# Patient Record
Sex: Female | Born: 1980 | Race: Black or African American | Hispanic: No | Marital: Single | Smoking: Never smoker
Health system: Southern US, Community
[De-identification: ages and names within clinical notes are randomized; demographics above are authoritative.]

## PROBLEM LIST (undated history)

## (undated) DIAGNOSIS — R011 Cardiac murmur, unspecified: Secondary | ICD-10-CM

## (undated) DIAGNOSIS — L709 Acne, unspecified: Secondary | ICD-10-CM

## (undated) DIAGNOSIS — D649 Anemia, unspecified: Secondary | ICD-10-CM

## (undated) DIAGNOSIS — K219 Gastro-esophageal reflux disease without esophagitis: Secondary | ICD-10-CM

## (undated) DIAGNOSIS — I10 Essential (primary) hypertension: Secondary | ICD-10-CM

## (undated) HISTORY — PX: OTHER SURGICAL HISTORY: SHX169

## (undated) HISTORY — DX: Essential (primary) hypertension: I10

## (undated) HISTORY — DX: Acne, unspecified: L70.9

---
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
## 2000-02-09 ENCOUNTER — Inpatient Hospital Stay (HOSPITAL_COMMUNITY): Admission: AD | Admit: 2000-02-09 | Discharge: 2000-02-09 | Payer: Self-pay | Admitting: Obstetrics

## 2000-05-12 ENCOUNTER — Inpatient Hospital Stay (HOSPITAL_COMMUNITY): Admission: AD | Admit: 2000-05-12 | Discharge: 2000-05-12 | Payer: Self-pay | Admitting: Obstetrics

## 2000-08-23 ENCOUNTER — Inpatient Hospital Stay (HOSPITAL_COMMUNITY): Admission: AD | Admit: 2000-08-23 | Discharge: 2000-08-23 | Payer: Self-pay | Admitting: Obstetrics

## 2000-11-10 ENCOUNTER — Inpatient Hospital Stay (HOSPITAL_COMMUNITY): Admission: AD | Admit: 2000-11-10 | Discharge: 2000-11-10 | Payer: Self-pay | Admitting: Obstetrics

## 2000-12-14 ENCOUNTER — Inpatient Hospital Stay (HOSPITAL_COMMUNITY): Admission: AD | Admit: 2000-12-14 | Discharge: 2000-12-14 | Payer: Self-pay | Admitting: Obstetrics

## 2001-01-24 ENCOUNTER — Inpatient Hospital Stay (HOSPITAL_COMMUNITY): Admission: AD | Admit: 2001-01-24 | Discharge: 2001-01-24 | Payer: Self-pay | Admitting: Obstetrics

## 2001-06-03 ENCOUNTER — Inpatient Hospital Stay (HOSPITAL_COMMUNITY): Admission: AD | Admit: 2001-06-03 | Discharge: 2001-06-03 | Payer: Self-pay | Admitting: Obstetrics

## 2001-12-03 ENCOUNTER — Inpatient Hospital Stay (HOSPITAL_COMMUNITY): Admission: AD | Admit: 2001-12-03 | Discharge: 2001-12-03 | Payer: Self-pay | Admitting: Obstetrics

## 2002-01-16 ENCOUNTER — Encounter: Payer: Self-pay | Admitting: Obstetrics

## 2002-01-16 ENCOUNTER — Ambulatory Visit (HOSPITAL_COMMUNITY): Admission: RE | Admit: 2002-01-16 | Discharge: 2002-01-16 | Payer: Self-pay | Admitting: Obstetrics

## 2002-01-31 ENCOUNTER — Emergency Department (HOSPITAL_COMMUNITY): Admission: EM | Admit: 2002-01-31 | Discharge: 2002-01-31 | Payer: Self-pay | Admitting: Emergency Medicine

## 2002-03-09 ENCOUNTER — Observation Stay (HOSPITAL_COMMUNITY): Admission: AD | Admit: 2002-03-09 | Discharge: 2002-03-10 | Payer: Self-pay | Admitting: Obstetrics

## 2002-03-09 ENCOUNTER — Encounter: Payer: Self-pay | Admitting: Obstetrics

## 2002-03-19 ENCOUNTER — Inpatient Hospital Stay (HOSPITAL_COMMUNITY): Admission: AD | Admit: 2002-03-19 | Discharge: 2002-03-19 | Payer: Self-pay | Admitting: Obstetrics

## 2002-05-28 ENCOUNTER — Inpatient Hospital Stay (HOSPITAL_COMMUNITY): Admission: AD | Admit: 2002-05-28 | Discharge: 2002-05-28 | Payer: Self-pay | Admitting: Obstetrics

## 2002-07-14 ENCOUNTER — Inpatient Hospital Stay (HOSPITAL_COMMUNITY): Admission: AD | Admit: 2002-07-14 | Discharge: 2002-07-16 | Payer: Self-pay | Admitting: Obstetrics

## 2003-01-25 ENCOUNTER — Encounter (INDEPENDENT_AMBULATORY_CARE_PROVIDER_SITE_OTHER): Payer: Self-pay

## 2003-01-25 ENCOUNTER — Inpatient Hospital Stay (HOSPITAL_COMMUNITY): Admission: AD | Admit: 2003-01-25 | Discharge: 2003-01-25 | Payer: Self-pay | Admitting: Obstetrics

## 2003-02-27 ENCOUNTER — Inpatient Hospital Stay (HOSPITAL_COMMUNITY): Admission: AD | Admit: 2003-02-27 | Discharge: 2003-02-27 | Payer: Self-pay | Admitting: Obstetrics

## 2003-06-15 ENCOUNTER — Inpatient Hospital Stay (HOSPITAL_COMMUNITY): Admission: AD | Admit: 2003-06-15 | Discharge: 2003-06-15 | Payer: Self-pay | Admitting: Obstetrics

## 2003-07-24 ENCOUNTER — Inpatient Hospital Stay (HOSPITAL_COMMUNITY): Admission: AD | Admit: 2003-07-24 | Discharge: 2003-07-24 | Payer: Self-pay | Admitting: Obstetrics

## 2003-08-31 ENCOUNTER — Inpatient Hospital Stay (HOSPITAL_COMMUNITY): Admission: AD | Admit: 2003-08-31 | Discharge: 2003-08-31 | Payer: Self-pay | Admitting: Obstetrics & Gynecology

## 2003-09-18 ENCOUNTER — Inpatient Hospital Stay (HOSPITAL_COMMUNITY): Admission: AD | Admit: 2003-09-18 | Discharge: 2003-09-18 | Payer: Self-pay | Admitting: Obstetrics and Gynecology

## 2003-11-07 ENCOUNTER — Emergency Department (HOSPITAL_COMMUNITY): Admission: EM | Admit: 2003-11-07 | Discharge: 2003-11-08 | Payer: Self-pay | Admitting: Emergency Medicine

## 2004-01-05 ENCOUNTER — Inpatient Hospital Stay (HOSPITAL_COMMUNITY): Admission: AD | Admit: 2004-01-05 | Discharge: 2004-01-05 | Payer: Self-pay | Admitting: Family Medicine

## 2005-01-27 ENCOUNTER — Inpatient Hospital Stay (HOSPITAL_COMMUNITY): Admission: AD | Admit: 2005-01-27 | Discharge: 2005-01-27 | Payer: Self-pay | Admitting: Obstetrics

## 2006-05-17 ENCOUNTER — Emergency Department (HOSPITAL_COMMUNITY): Admission: EM | Admit: 2006-05-17 | Discharge: 2006-05-17 | Payer: Self-pay | Admitting: Emergency Medicine

## 2006-09-07 ENCOUNTER — Emergency Department (HOSPITAL_COMMUNITY): Admission: EM | Admit: 2006-09-07 | Discharge: 2006-09-07 | Payer: Self-pay | Admitting: Family Medicine

## 2006-09-26 ENCOUNTER — Emergency Department (HOSPITAL_COMMUNITY): Admission: EM | Admit: 2006-09-26 | Discharge: 2006-09-26 | Payer: Self-pay | Admitting: Emergency Medicine

## 2007-01-19 ENCOUNTER — Emergency Department (HOSPITAL_COMMUNITY): Admission: EM | Admit: 2007-01-19 | Discharge: 2007-01-19 | Payer: Self-pay | Admitting: Emergency Medicine

## 2007-07-11 ENCOUNTER — Emergency Department (HOSPITAL_COMMUNITY): Admission: EM | Admit: 2007-07-11 | Discharge: 2007-07-11 | Payer: Self-pay | Admitting: Family Medicine

## 2007-12-30 ENCOUNTER — Emergency Department (HOSPITAL_COMMUNITY): Admission: EM | Admit: 2007-12-30 | Discharge: 2007-12-30 | Payer: Self-pay | Admitting: Emergency Medicine

## 2008-02-18 ENCOUNTER — Emergency Department (HOSPITAL_COMMUNITY): Admission: EM | Admit: 2008-02-18 | Discharge: 2008-02-18 | Payer: Self-pay | Admitting: Family Medicine

## 2008-04-22 ENCOUNTER — Emergency Department (HOSPITAL_COMMUNITY): Admission: EM | Admit: 2008-04-22 | Discharge: 2008-04-22 | Payer: Self-pay | Admitting: Family Medicine

## 2008-07-21 ENCOUNTER — Emergency Department (HOSPITAL_COMMUNITY): Admission: EM | Admit: 2008-07-21 | Discharge: 2008-07-21 | Payer: Self-pay | Admitting: Emergency Medicine

## 2009-02-25 ENCOUNTER — Emergency Department (HOSPITAL_COMMUNITY): Admission: EM | Admit: 2009-02-25 | Discharge: 2009-02-25 | Payer: Self-pay | Admitting: Family Medicine

## 2009-05-19 ENCOUNTER — Emergency Department (HOSPITAL_COMMUNITY): Admission: EM | Admit: 2009-05-19 | Discharge: 2009-05-19 | Payer: Self-pay | Admitting: Family Medicine

## 2009-10-31 ENCOUNTER — Emergency Department (HOSPITAL_COMMUNITY): Admission: EM | Admit: 2009-10-31 | Discharge: 2009-10-31 | Payer: Self-pay | Admitting: Emergency Medicine

## 2010-02-07 ENCOUNTER — Inpatient Hospital Stay (HOSPITAL_COMMUNITY): Admission: AD | Admit: 2010-02-07 | Discharge: 2010-02-07 | Payer: Self-pay | Admitting: Family Medicine

## 2010-05-06 ENCOUNTER — Emergency Department (HOSPITAL_COMMUNITY): Admission: EM | Admit: 2010-05-06 | Discharge: 2010-05-06 | Payer: Self-pay | Admitting: Emergency Medicine

## 2010-09-09 ENCOUNTER — Inpatient Hospital Stay (HOSPITAL_COMMUNITY)
Admission: AD | Admit: 2010-09-09 | Discharge: 2010-09-09 | Disposition: A | Discharge status: Home / Self Care | Payer: Medicare Other | Source: Ambulatory Visit | Attending: Obstetrics | Admitting: Obstetrics

## 2010-09-09 DIAGNOSIS — O4690 Antepartum hemorrhage, unspecified, unspecified trimester: Secondary | ICD-10-CM | POA: Insufficient documentation

## 2010-09-09 LAB — URINALYSIS, ROUTINE W REFLEX MICROSCOPIC
Glucose, UA: NEGATIVE mg/dL
Nitrite: NEGATIVE
Specific Gravity, Urine: 1.02 (ref 1.005–1.030)
pH: 7.5 (ref 5.0–8.0)

## 2010-09-09 LAB — HCG, QUANTITATIVE, PREGNANCY: hCG, Beta Chain, Quant, S: 197624 m[IU]/mL — ABNORMAL HIGH (ref <5)

## 2010-09-09 LAB — CBC
Hemoglobin: 13 g/dL (ref 12.0–15.0)
MCH: 27.4 pg (ref 26.0–34.0)
MCHC: 33.1 g/dL (ref 30.0–36.0)
MCV: 82.8 fL (ref 78.0–100.0)
Platelets: 284 10*3/uL (ref 150–400)

## 2010-09-09 LAB — POCT PREGNANCY, URINE: Preg Test, Ur: POSITIVE

## 2010-09-09 LAB — WET PREP, GENITAL: Trich, Wet Prep: NONE SEEN

## 2010-09-13 ENCOUNTER — Inpatient Hospital Stay (HOSPITAL_COMMUNITY)
Admission: AD | Admit: 2010-09-13 | Discharge: 2010-09-15 | DRG: 775 | Disposition: A | Discharge status: Home / Self Care | Payer: Medicare Other | Source: Ambulatory Visit | Attending: Obstetrics | Admitting: Obstetrics

## 2010-09-13 DIAGNOSIS — O99892 Other specified diseases and conditions complicating childbirth: Principal | ICD-10-CM | POA: Diagnosis present

## 2010-09-13 DIAGNOSIS — Z22330 Carrier of Group B streptococcus: Secondary | ICD-10-CM

## 2010-09-13 LAB — POCT URINALYSIS DIP (DEVICE)
Glucose, UA: NEGATIVE mg/dL
Nitrite: NEGATIVE
Protein, ur: NEGATIVE mg/dL
Specific Gravity, Urine: 1.025 (ref 1.005–1.030)
Urobilinogen, UA: 1 mg/dL (ref 0.0–1.0)

## 2010-09-13 LAB — DIFFERENTIAL
Eosinophils Absolute: 0.1 10*3/uL (ref 0.0–0.7)
Eosinophils Relative: 1 % (ref 0–5)
Lymphs Abs: 2.4 10*3/uL (ref 0.7–4.0)
Monocytes Absolute: 0.4 10*3/uL (ref 0.1–1.0)

## 2010-09-13 LAB — CBC
HCT: 32.9 % — ABNORMAL LOW (ref 36.0–46.0)
MCH: 26.5 pg (ref 26.0–34.0)
MCV: 80.8 fL (ref 78.0–100.0)
Platelets: 256 10*3/uL (ref 150–400)
RBC: 4.07 MIL/uL (ref 3.87–5.11)

## 2010-09-13 LAB — TYPE AND SCREEN

## 2010-09-13 LAB — ABO/RH: ABO/RH(D): B POS

## 2010-09-13 LAB — POCT PREGNANCY, URINE: Preg Test, Ur: NEGATIVE

## 2010-09-13 LAB — HEPATITIS B SURFACE ANTIGEN: Hepatitis B Surface Ag: NEGATIVE

## 2010-09-13 LAB — RPR: RPR Ser Ql: NONREACTIVE

## 2010-09-13 LAB — RUBELLA SCREEN: Rubella: 13.7 IU/mL — ABNORMAL HIGH

## 2010-09-13 LAB — RAPID HIV SCREEN (WH-MAU): Rapid HIV Screen: NONREACTIVE

## 2010-09-13 LAB — WET PREP, GENITAL: Yeast Wet Prep HPF POC: NONE SEEN

## 2010-09-14 LAB — CBC
HCT: 30.3 % — ABNORMAL LOW (ref 36.0–46.0)
Hemoglobin: 9.6 g/dL — ABNORMAL LOW (ref 12.0–15.0)
MCH: 25.9 pg — ABNORMAL LOW (ref 26.0–34.0)
MCV: 81.7 fL (ref 78.0–100.0)
Platelets: 227 10*3/uL (ref 150–400)
RBC: 3.71 MIL/uL — ABNORMAL LOW (ref 3.87–5.11)
WBC: 9.2 10*3/uL (ref 4.0–10.5)

## 2010-09-28 LAB — POCT URINALYSIS DIP (DEVICE)
Hgb urine dipstick: NEGATIVE
Nitrite: NEGATIVE
Protein, ur: NEGATIVE mg/dL
pH: 7 (ref 5.0–8.0)

## 2010-09-28 LAB — WET PREP, GENITAL

## 2010-09-30 LAB — POCT URINALYSIS DIP (DEVICE)
Bilirubin Urine: NEGATIVE
Glucose, UA: NEGATIVE mg/dL
Hgb urine dipstick: NEGATIVE
Nitrite: NEGATIVE
Urobilinogen, UA: 1 mg/dL (ref 0.0–1.0)
pH: 7.5 (ref 5.0–8.0)

## 2010-09-30 LAB — GC/CHLAMYDIA PROBE AMP, GENITAL: GC Probe Amp, Genital: NEGATIVE

## 2010-09-30 LAB — WET PREP, GENITAL
WBC, Wet Prep HPF POC: NONE SEEN
Yeast Wet Prep HPF POC: NONE SEEN

## 2010-11-11 NOTE — Op Note (Signed)
   Christy Huang, Christy Huang                          ACCOUNT NO.:  [id number]   MEDICAL RECORD NO.:  [id number]                   PATIENT TYPE:   LOCATION:                                       FACILITY:   PHYSICIAN:  Kathreen Cosier, M.D.           DATE OF BIRTH:   DATE OF PROCEDURE:  07/14/2002  DATE OF DISCHARGE:                                 OPERATIVE REPORT   DELIVERY NOTE:  At 6:31, the patient was 9 cm.  Fetal heart went down to  between 90 and 110.  Amnioinfusion was begun.  Cervix reduced.  The patient  pushed until the vertex was +3 station, and she was tired and in between  contractions, the vertex would retreat, with the fetal heart ranging between  80 and 140.  A vacuum was applied at +3 station.  At 6:52 a.m. it was  applied through two contractions with relaxation in between contractions.  She was delivered in the OP position of a female, Apgar 8 and 9.  It was  noted that the cord was down by her shoulder.  There were no pop-offs of the  vacuum when pressure was applied.  The placenta was spontaneously.  She had  a second degree perineal, which was repaired with 2-0 Vicryl.                                               Kathreen Cosier, M.D.    BAM/MEDQ  D:  07/14/2002  T:  07/14/2002  Job:  [id number]

## 2011-03-16 LAB — POCT PREGNANCY, URINE: Operator id: 247071

## 2011-03-16 LAB — POCT URINALYSIS DIP (DEVICE)
Glucose, UA: NEGATIVE
Hgb urine dipstick: NEGATIVE
Nitrite: NEGATIVE
Protein, ur: 30 — AB
Specific Gravity, Urine: 1.02
Urobilinogen, UA: 0.2

## 2011-03-23 LAB — POCT URINALYSIS DIP (DEVICE)
Bilirubin Urine: NEGATIVE
Glucose, UA: NEGATIVE
Ketones, ur: NEGATIVE
Nitrite: NEGATIVE
Operator id: 235561

## 2011-03-23 LAB — GC/CHLAMYDIA PROBE AMP, GENITAL
Chlamydia, DNA Probe: NEGATIVE
GC Probe Amp, Genital: NEGATIVE

## 2011-03-23 LAB — WET PREP, GENITAL
Trich, Wet Prep: NONE SEEN
Yeast Wet Prep HPF POC: NONE SEEN

## 2011-03-23 LAB — POCT PREGNANCY, URINE
Operator id: 235561
Preg Test, Ur: NEGATIVE

## 2011-03-27 LAB — GC/CHLAMYDIA PROBE AMP, GENITAL: GC Probe Amp, Genital: NEGATIVE

## 2011-03-27 LAB — WET PREP, GENITAL
Trich, Wet Prep: NONE SEEN
WBC, Wet Prep HPF POC: NONE SEEN

## 2011-10-11 ENCOUNTER — Encounter (HOSPITAL_COMMUNITY): Payer: Self-pay | Admitting: Emergency Medicine

## 2011-10-11 ENCOUNTER — Emergency Department (INDEPENDENT_AMBULATORY_CARE_PROVIDER_SITE_OTHER)
Admission: EM | Admit: 2011-10-11 | Discharge: 2011-10-11 | Disposition: A | Discharge status: Home / Self Care | Payer: Medicare Other | Source: Home / Self Care | Attending: Family Medicine | Admitting: Family Medicine

## 2011-10-11 DIAGNOSIS — IMO0002 Reserved for concepts with insufficient information to code with codable children: Secondary | ICD-10-CM | POA: Diagnosis not present

## 2011-10-11 DIAGNOSIS — L02412 Cutaneous abscess of left axilla: Secondary | ICD-10-CM

## 2011-10-11 NOTE — ED Notes (Signed)
Pt has a history of having boils. This one started about 3-4 days ago and very rapidly increased in size and tenderness. Pt states the boil "burst" a couple hours ago. Pt taking Doxycycline for recurrent boils for about 1 year now.

## 2011-10-11 NOTE — Discharge Instructions (Signed)
Warm compress twice a day when you take the antibiotic, take medicine for 10 days., return as needed.

## 2011-10-11 NOTE — ED Provider Notes (Signed)
History     CSN: [id number]  Arrival date & time 10/11/11  1436   First MD Initiated Contact with Patient 10/11/11 1500      Chief Complaint  Patient presents with  . Recurrent Skin Infections    (Consider location/radiation/quality/duration/timing/severity/associated sxs/prior treatment) Patient is a 31 y.o. female presenting with abscess. The history is provided by the patient.  Abscess  This is a new problem. The current episode started less than one week ago. The onset was gradual. The problem has been gradually improving (began draining with self care prior to arrival). The abscess is present on the left arm. The problem is mild. The abscess is characterized by painfulness.    History reviewed. No pertinent past medical history.  History reviewed. No pertinent past surgical history.  History reviewed. No pertinent family history.  History  Substance Use Topics  . Smoking status: Not on file  . Smokeless tobacco: Not on file  . Alcohol Use: Not on file    OB History    Grav Para Term Preterm Abortions TAB SAB Ect Mult Living                  Review of Systems  Constitutional: Negative.   Skin: Positive for rash.    Allergies  Sulfa antibiotics  Home Medications   Current Outpatient Rx  Name Route Sig Dispense Refill  . DOXYCYCLINE HYCLATE 100 MG PO TABS Oral Take 100 mg by mouth 2 (two) times daily.      BP 143/84  Pulse 81  Temp(Src) 98.4 F (36.9 C) (Oral)  Resp 18  SpO2 98%  Physical Exam  Nursing note and vitals reviewed. Constitutional: She appears well-developed and well-nourished. She appears distressed.  Skin: Skin is warm and dry.       Draining left axillary abscess, sl indurated and tender.    ED Course  Procedures (including critical care time)   Labs Reviewed  CULTURE, ROUTINE-ABSCESS   No results found.   1. Abscess of axilla, left       MDM          Linna Hoff, MD 10/18/11 [phone]

## 2011-10-14 LAB — CULTURE, ROUTINE-ABSCESS: Gram Stain: NONE SEEN

## 2012-01-22 DIAGNOSIS — Z124 Encounter for screening for malignant neoplasm of cervix: Secondary | ICD-10-CM | POA: Diagnosis not present

## 2012-01-22 DIAGNOSIS — I10 Essential (primary) hypertension: Secondary | ICD-10-CM | POA: Diagnosis not present

## 2012-01-22 DIAGNOSIS — Z113 Encounter for screening for infections with a predominantly sexual mode of transmission: Secondary | ICD-10-CM | POA: Diagnosis not present

## 2012-01-22 DIAGNOSIS — Z0000 Encounter for general adult medical examination without abnormal findings: Secondary | ICD-10-CM | POA: Diagnosis not present

## 2012-03-12 IMAGING — US US OB COMP LESS 14 WK
1 series · 14 of 28 positions shown · non-contrast
Comparison: none

OBSTETRICAL ULTRASOUND:
 This ultrasound exam was performed in the [HOSPITAL] Ultrasound Department.  The OB US report was generated in the AS system, and faxed to the ordering physician.  This report is also available in [HOSPITAL]?s Access[employer] and in [REDACTED] PACS.

[Series 1: us ob comp less 14 wks · 14 of 30 slices shown]
[im 2/30]
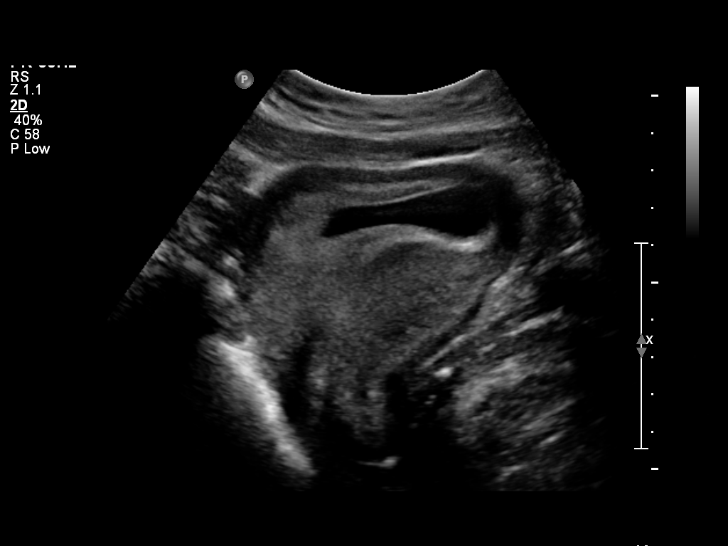
[im 4/30]
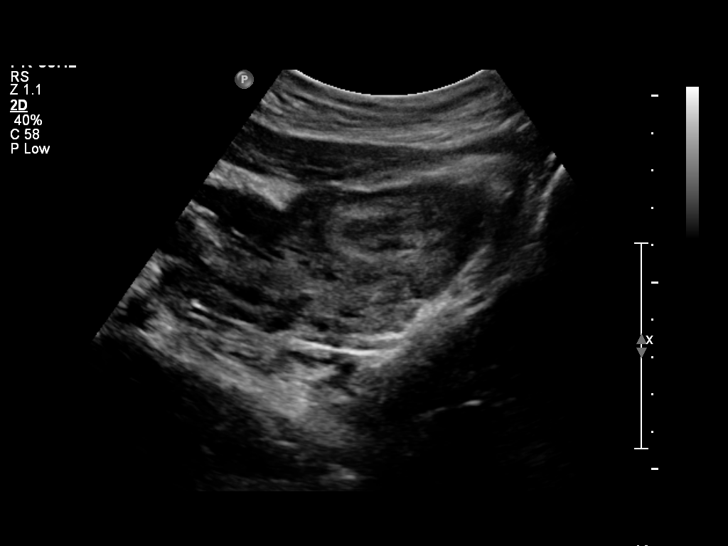
[im 6/30]
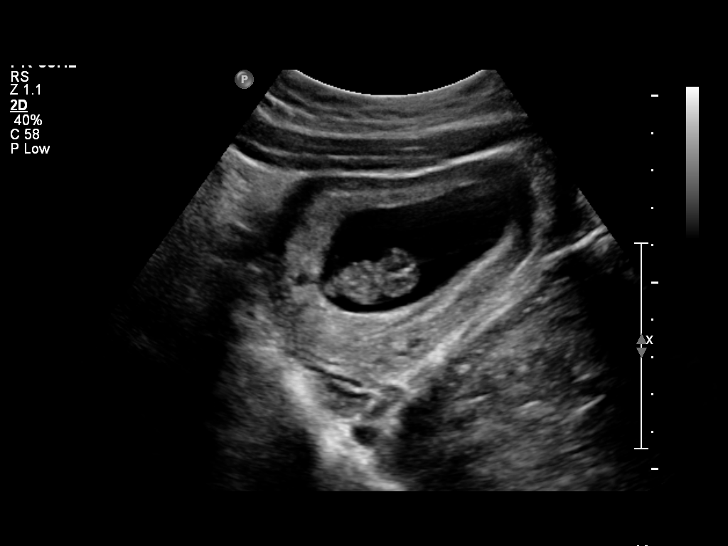
[im 8/30]
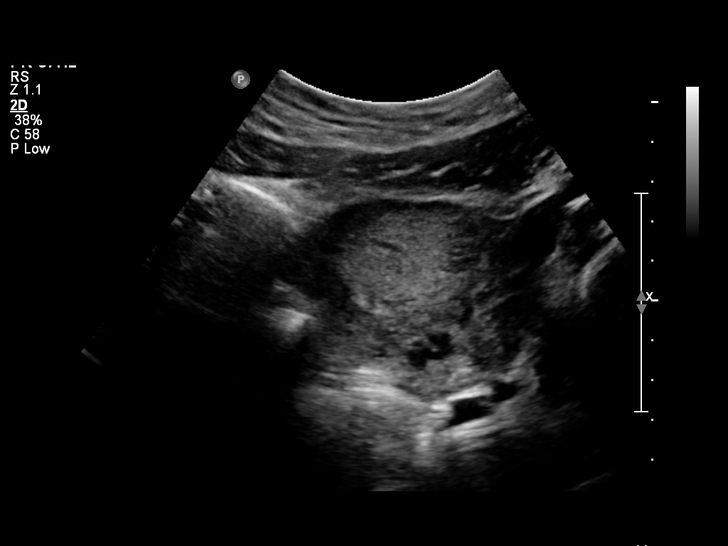
[im 10/30]
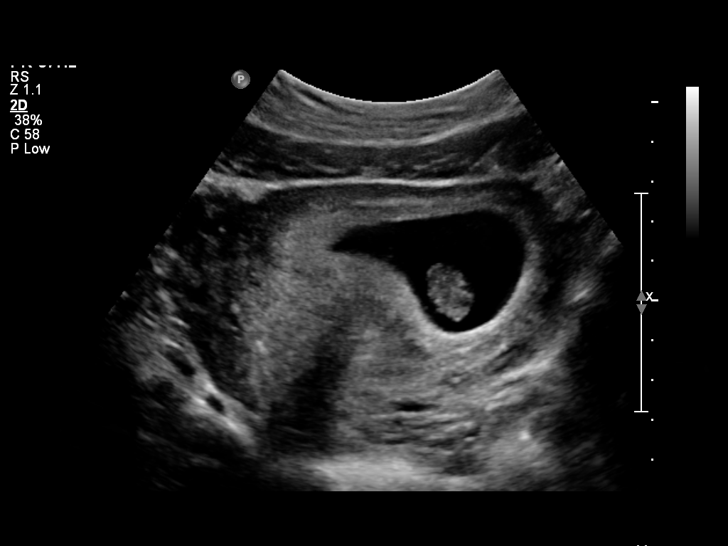
[im 12/30]
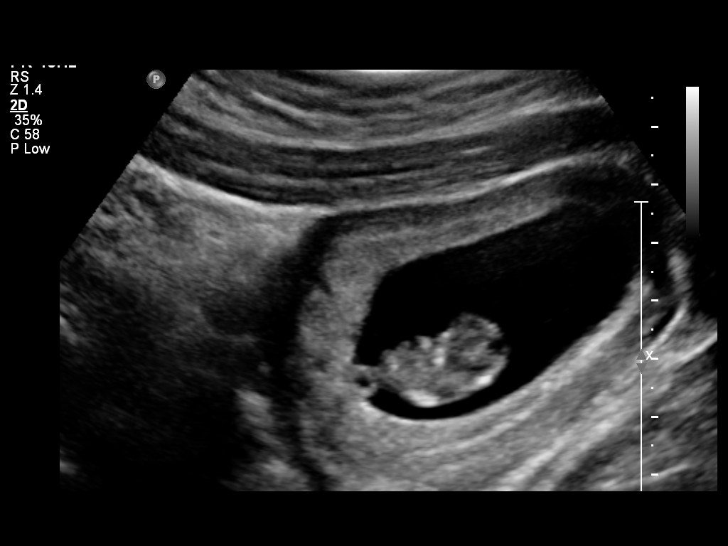
[im 14/30]
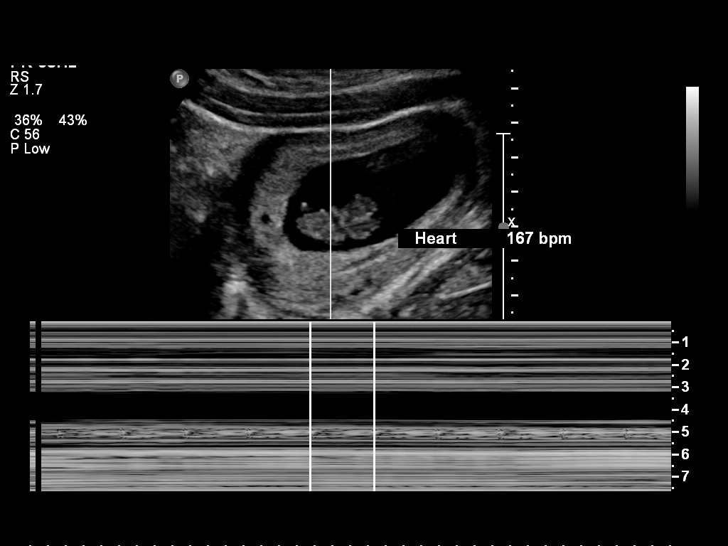
[im 17/30]
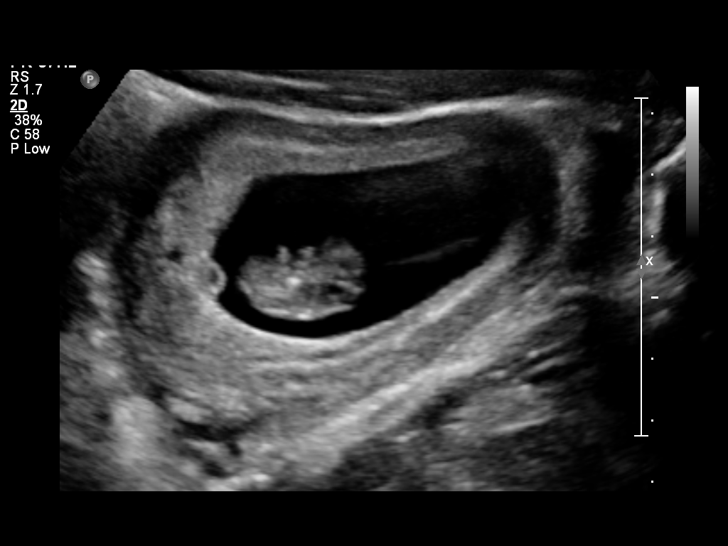
[im 19/30]
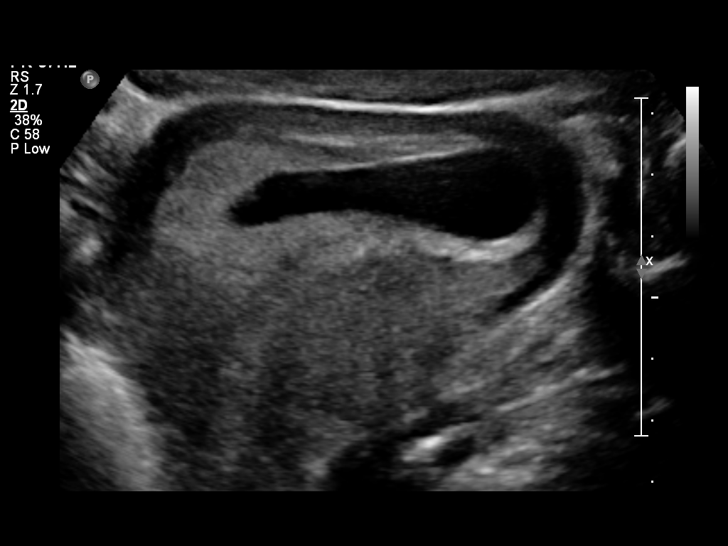
[im 21/30]
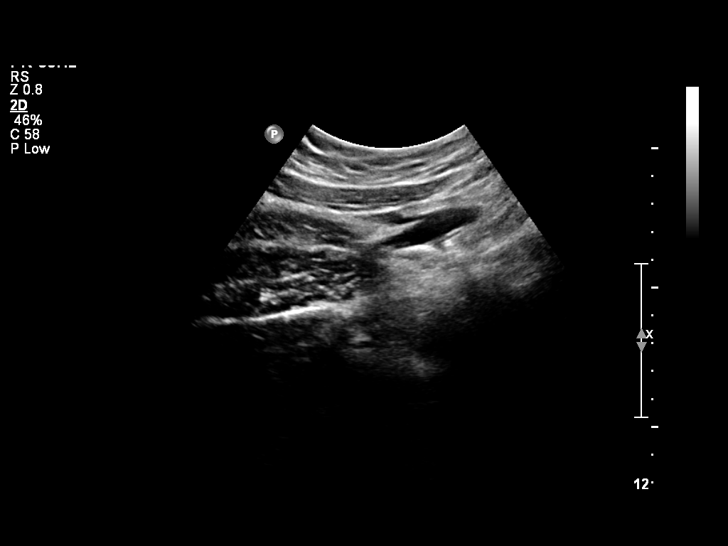
[im 23/30]
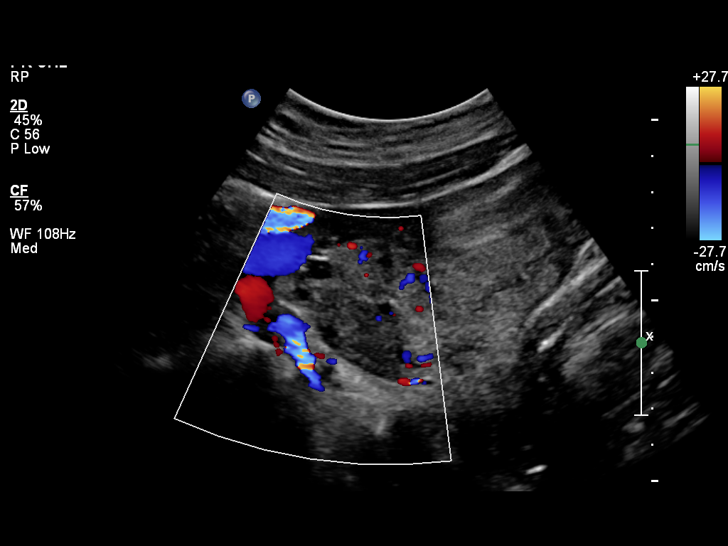
[im 25/30]
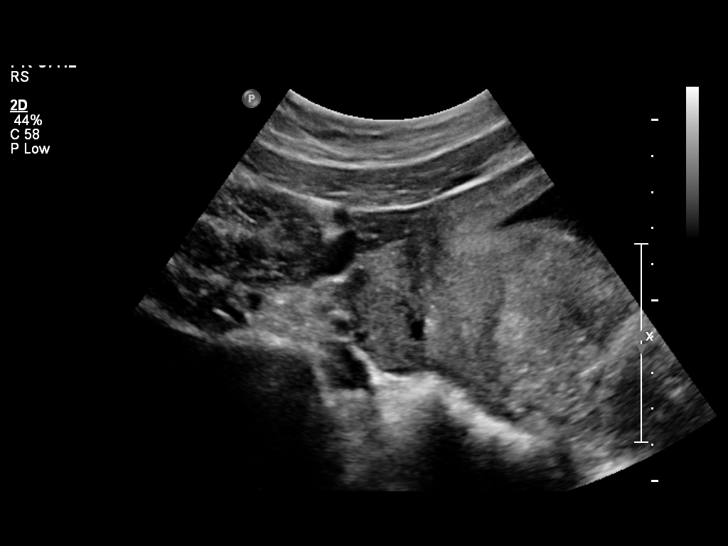
[im 27/30]
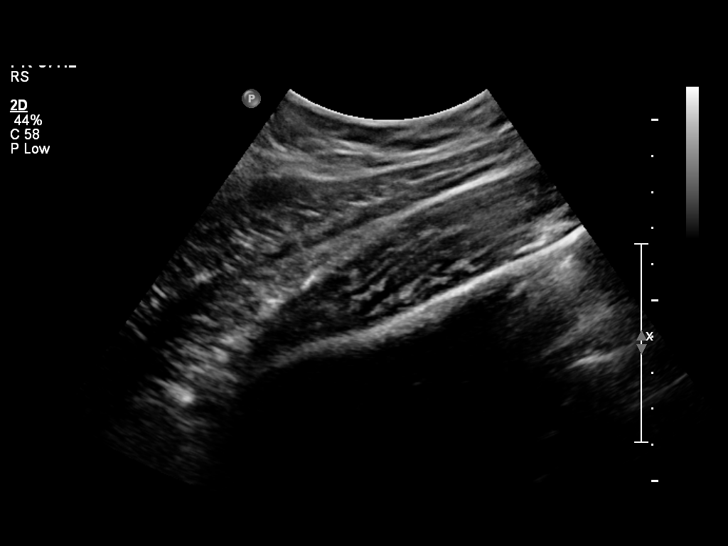
[im 30/30]
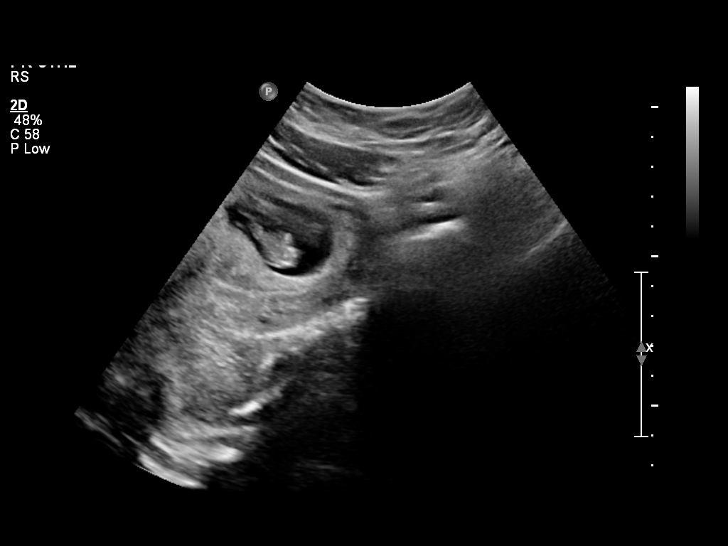

[14 of 28 positions shown; findings below may reference images not displayed]

IMPRESSION: See [facility] Obstetric US report.

## 2013-02-10 ENCOUNTER — Other Ambulatory Visit: Payer: Self-pay | Admitting: Obstetrics

## 2013-02-10 NOTE — Telephone Encounter (Signed)
Please advise 

## 2013-02-12 NOTE — Telephone Encounter (Signed)
Rx refill denied pharmacist advised to have patient contact office

## 2013-02-13 ENCOUNTER — Other Ambulatory Visit: Payer: Self-pay | Admitting: Obstetrics

## 2013-03-10 ENCOUNTER — Ambulatory Visit: Payer: Self-pay | Admitting: Obstetrics

## 2013-03-24 ENCOUNTER — Ambulatory Visit: Payer: Self-pay | Admitting: Obstetrics

## 2013-04-16 ENCOUNTER — Ambulatory Visit (INDEPENDENT_AMBULATORY_CARE_PROVIDER_SITE_OTHER): Payer: Medicare Other | Admitting: Obstetrics

## 2013-04-16 ENCOUNTER — Encounter: Payer: Self-pay | Admitting: Obstetrics

## 2013-04-16 VITALS — BP 138/100 | HR 73 | Temp 98.0°F | Ht 62.0 in | Wt 186.0 lb

## 2013-04-16 DIAGNOSIS — L732 Hidradenitis suppurativa: Secondary | ICD-10-CM | POA: Diagnosis not present

## 2013-04-16 DIAGNOSIS — Z0000 Encounter for general adult medical examination without abnormal findings: Secondary | ICD-10-CM | POA: Diagnosis not present

## 2013-04-16 DIAGNOSIS — N760 Acute vaginitis: Secondary | ICD-10-CM | POA: Insufficient documentation

## 2013-04-16 MED ORDER — CLINDAMYCIN PHOSPHATE 1 % EX SOLN: Freq: Two times a day (BID) | CUTANEOUS | Status: DC

## 2013-04-16 NOTE — Progress Notes (Signed)
Subjective:     Christy Huang is a 32 y.o. female here for a routine exam.  Current complaints: Patient is in the office today for annual exam. Patient request STD testing today.  Personal health questionnaire reviewed: yes.   Gynecologic History Patient's last menstrual period was 04/07/2013. Contraception: condoms Last Pap: over 1 year. Results were: normal   Obstetric History OB History  No data available     The following portions of the patient's history were reviewed and updated as appropriate: allergies, current medications, past family history, past medical history, past social history, past surgical history and problem list.  Review of Systems Pertinent items are noted in HPI.    Objective:    General appearance: alert and no distress Breasts: normal appearance, no masses or tenderness, Taught monthly breast self examination Abdomen: normal findings: soft, non-tender Pelvic: cervix normal in appearance, external genitalia normal, no adnexal masses or tenderness, no cervical motion tenderness, uterus normal size, shape, and consistency and vagina normal without discharge    Assessment:    Healthy female exam.   H/O hidradenitis.  On Doxycycline prn.   Plan:    F/U prn.

## 2013-04-17 LAB — GC/CHLAMYDIA PROBE AMP: CT Probe RNA: NEGATIVE

## 2013-04-17 LAB — HIV ANTIBODY (ROUTINE TESTING W REFLEX): HIV: NONREACTIVE

## 2013-04-17 LAB — HEPATITIS C ANTIBODY: HCV Ab: NEGATIVE

## 2013-04-18 LAB — PAP IG W/ RFLX HPV ASCU

## 2013-05-02 ENCOUNTER — Other Ambulatory Visit: Payer: Self-pay | Admitting: Obstetrics

## 2013-05-06 ENCOUNTER — Other Ambulatory Visit: Payer: Self-pay | Admitting: Obstetrics

## 2014-05-04 ENCOUNTER — Other Ambulatory Visit: Payer: Self-pay | Admitting: Obstetrics

## 2014-05-12 ENCOUNTER — Telehealth: Payer: Self-pay | Admitting: *Deleted

## 2014-05-12 NOTE — Telephone Encounter (Signed)
Patient is calling requesting a refill on her Dyazide and a referral for a primary care provider for her blood pressure.

## 2014-05-13 ENCOUNTER — Other Ambulatory Visit: Payer: Self-pay | Admitting: Obstetrics

## 2014-05-13 DIAGNOSIS — I10 Essential (primary) hypertension: Secondary | ICD-10-CM

## 2014-05-13 MED ORDER — TRIAMTERENE-HCTZ 37.5-25 MG PO CAPS: 1.0000 | ORAL_CAPSULE | Freq: Every morning | ORAL | Status: DC

## 2014-05-13 NOTE — Telephone Encounter (Signed)
Dyazide Rx Referred to Internal Medicine.

## 2014-05-20 ENCOUNTER — Ambulatory Visit: Payer: Medicare Other | Admitting: Obstetrics

## 2014-05-22 NOTE — Telephone Encounter (Signed)
Attempted to contact patient   Unable to leave message

## 2014-06-11 ENCOUNTER — Other Ambulatory Visit: Payer: Self-pay | Admitting: *Deleted

## 2014-06-11 DIAGNOSIS — B379 Candidiasis, unspecified: Secondary | ICD-10-CM

## 2014-06-11 DIAGNOSIS — T3695XA Adverse effect of unspecified systemic antibiotic, initial encounter: Principal | ICD-10-CM

## 2014-06-11 MED ORDER — FLUCONAZOLE 200 MG PO TABS: ORAL_TABLET | ORAL | Status: DC

## 2014-06-11 NOTE — Telephone Encounter (Signed)
Patient advised prescription had been sent to the pharmacy and the referral has been made for an Internist. Patient states she is on Doxycycline and is starting to have symptoms of a yeast infection. Requesting a prescription to be sent to the pharmacy. Per nursing protocol prescription sent. Patient verbalized understanding.

## 2014-06-13 ENCOUNTER — Other Ambulatory Visit: Payer: Self-pay | Admitting: Obstetrics

## 2014-07-22 ENCOUNTER — Ambulatory Visit (INDEPENDENT_AMBULATORY_CARE_PROVIDER_SITE_OTHER): Payer: Medicare Other | Admitting: Obstetrics

## 2014-07-22 ENCOUNTER — Encounter: Payer: Self-pay | Admitting: Obstetrics

## 2014-07-22 VITALS — BP 132/90 | HR 66 | Wt 182.0 lb

## 2014-07-22 DIAGNOSIS — N760 Acute vaginitis: Secondary | ICD-10-CM | POA: Diagnosis not present

## 2014-07-22 DIAGNOSIS — L732 Hidradenitis suppurativa: Secondary | ICD-10-CM

## 2014-07-22 DIAGNOSIS — N898 Other specified noninflammatory disorders of vagina: Secondary | ICD-10-CM

## 2014-07-22 DIAGNOSIS — A499 Bacterial infection, unspecified: Secondary | ICD-10-CM

## 2014-07-22 DIAGNOSIS — B3731 Acute candidiasis of vulva and vagina: Secondary | ICD-10-CM

## 2014-07-22 DIAGNOSIS — B9689 Other specified bacterial agents as the cause of diseases classified elsewhere: Secondary | ICD-10-CM

## 2014-07-22 DIAGNOSIS — B373 Candidiasis of vulva and vagina: Secondary | ICD-10-CM

## 2014-07-22 MED ORDER — CLINDAMYCIN PHOSPHATE 1 % EX SOLN: Freq: Two times a day (BID) | CUTANEOUS | Status: DC

## 2014-07-22 MED ORDER — METRONIDAZOLE 500 MG PO TABS: 500.0000 mg | ORAL_TABLET | Freq: Two times a day (BID) | ORAL | Status: DC

## 2014-07-22 MED ORDER — FLUCONAZOLE 150 MG PO TABS: 150.0000 mg | ORAL_TABLET | Freq: Once | ORAL | Status: DC

## 2014-07-22 NOTE — Progress Notes (Signed)
  Patient ID: Christy Huang, female   DOB: 20-Apr-1981, 34 y.o.   MRN: [id number]  Chief Complaint  Patient presents with  . Vaginitis    vaginal itch, change in d/c, some odor, cramping    HPI Christy Huang is a 34 y.o. female.  Recurrent " boils ".  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  Now has malodorous vaginal discharge with itching. HPI  Past Medical History  Diagnosis Date  . Hypertension   . Acne     No past surgical history on file.  Family History  Problem Relation Age of Onset  . Diabetes Mother   . Hypertension Mother   . Diabetes Maternal Grandmother   . Hypertension Maternal Grandmother     Social History History  Substance Use Topics  . Smoking status: Current Some Day Smoker -- 0.20 packs/day    Types: Cigarettes  . Smokeless tobacco: Not on file  . Alcohol Use: No    Allergies  Allergen Reactions  . Sulfa Antibiotics     Current Outpatient Prescriptions  Medication Sig Dispense Refill  . doxycycline (VIBRA-TABS) 100 MG tablet TAKE 1 TABLET BY MOUTH TWICE A DAY 60 tablet 5  . triamterene-hydrochlorothiazide (DYAZIDE) 37.5-25 MG per capsule Take 1 each (1 capsule total) by mouth every morning. 30 capsule 7  . caffeine 200 MG TABS tablet Take 200 mg by mouth every 4 (four) hours as needed.    . clindamycin (CLEOCIN-T) 1 % external solution Apply topically 2 (two) times daily. 60 mL 5  . fluconazole (DIFLUCAN) 200 MG tablet TAKE 1 TABLET BY MOUTH EVERY OTHER DAY AS DIRECTED 3 tablet 2  . prenatal vitamin w/FE, FA (PRENATAL 1 + 1) 27-1 MG TABS tablet Take 1 tablet by mouth daily at 12 noon.     No current facility-administered medications for this visit.    Review of Systems Review of Systems Constitutional: negative for fatigue and weight loss Respiratory: negative for cough and wheezing Cardiovascular: negative for chest pain, fatigue and palpitations Gastrointestinal: negative for abdominal pain and change in bowel  habits Genitourinary: vaginal discharge with itching Integument/breast: negative for nipple discharge Musculoskeletal:negative for myalgias Neurological: negative for gait problems and tremors Behavioral/Psych: negative for abusive relationship, depression Endocrine: negative for temperature intolerance     Blood pressure 132/90, pulse 66, weight 182 lb (82.555 kg), last menstrual period 07/16/2014.  Physical Exam Physical Exam General:   alert  Skin:   no rash or abnormalities  Lungs:   clear to auscultation bilaterally  Heart:   regular rate and rhythm, S1, S2 normal, no murmur, click, rub or gallop  Breasts:   normal without suspicious masses, skin or nipple changes or axillary nodes  Abdomen:  normal findings: no organomegaly, soft, non-tender and no hernia  Pelvis:  External genitalia: normal general appearance Urinary system: urethral meatus normal and bladder without fullness, nontender Vaginal: normal without tenderness, induration or masses Cervix: normal appearance Adnexa: normal bimanual exam Uterus: anteverted and non-tender, normal size       Data Reviewed Labs    Assessment    Yeast vaginitis.  H/O boils.     Plan  Diflucan Rx Flagyl Rx   Orders Placed This Encounter  Procedures  . SureSwab, Vaginosis/Vaginitis Plus   No orders of the defined types were placed in this encounter.

## 2014-07-22 NOTE — Patient Instructions (Signed)
Hidradenitis Suppurativa, Sweat Gland Abscess Hidradenitis suppurativa is a long lasting (chronic), uncommon disease of the sweat glands. With this, boil-like lumps and scarring develop in the groin, some times under the arms (axillae), and under the breasts. It may also uncommonly occur behind the ears, in the crease of the buttocks, and around the genitals.  CAUSES  The cause is from a blocking of the sweat glands. They then become infected. It may cause drainage and odor. It is not contagious. So it cannot be given to someone else. It most often shows up in puberty (about 10 to 34 years of age). But it may happen much later. It is similar to acne which is a disease of the sweat glands. This condition is slightly more common in African-Americans and women. SYMPTOMS   Hidradenitis usually starts as one or more red, tender, swellings in the groin or under the arms (axilla).  Over a period of hours to days the lesions get larger. They often open to the skin surface, draining clear to yellow-colored fluid.  The infected area heals with scarring. DIAGNOSIS  Your caregiver makes this diagnosis by looking at you. Sometimes cultures (growing germs on plates in the lab) may be taken. This is to see what germ (bacterium) is causing the infection.  TREATMENT   Topical germ killing medicine applied to the skin (antibiotics) are the treatment of choice. Antibiotics taken by mouth (systemic) are sometimes needed when the condition is getting worse or is severe.  Avoid tight-fitting clothing which traps moisture in.  Dirt does not cause hidradenitis and it is not caused by poor hygiene.  Involved areas should be cleaned daily using an antibacterial soap. Some patients find that the liquid form of Lever 2000, applied to the involved areas as a lotion after bathing, can help reduce the odor related to this condition.  Sometimes surgery is needed to drain infected areas or remove scarred tissue. Removal of  large amounts of tissue is used only in severe cases.  Birth control pills may be helpful.  Oral retinoids (vitamin A derivatives) for 6 to 12 months which are effective for acne may also help this condition.  Weight loss will improve but not cure hidradenitis. It is made worse by being overweight. But the condition is not caused by being overweight.  This condition is more common in people who have had acne.  It may become worse under stress. There is no medical cure for hidradenitis. It can be controlled, but not cured. The condition usually continues for years with periods of getting worse and getting better (remission). Document Released: 01/25/2004 Document Revised: 09/04/2011 Document Reviewed: 09/12/2013 [employer] Patient Information 2015 [employer]. This information is not intended to replace advice given to you by your health care provider. Make sure you discuss any questions you have with your health care provider.  

## 2014-07-26 LAB — SURESWAB, VAGINOSIS/VAGINITIS PLUS
ATOPOBIUM VAGINAE: 6 Log (cells/mL)
BV CATEGORY: UNDETERMINED — AB
C. ALBICANS, DNA: NOT DETECTED
C. PARAPSILOSIS, DNA: NOT DETECTED
C. TROPICALIS, DNA: NOT DETECTED
C. glabrata, DNA: NOT DETECTED
C. trachomatis RNA, TMA: NOT DETECTED
Gardnerella vaginalis: 8 Log (cells/mL)
LACTOBACILLUS SPECIES: 5.6 Log (cells/mL)
MEGASPHAERA SPECIES: 7.6 Log (cells/mL)
N. gonorrhoeae RNA, TMA: NOT DETECTED
T. vaginalis RNA, QL TMA: NOT DETECTED

## 2014-07-27 ENCOUNTER — Encounter: Payer: Self-pay | Admitting: Obstetrics

## 2014-07-27 NOTE — Addendum Note (Signed)
Addended by: Brock BadHARPER, Lani Mendiola A on: 07/27/2014 06:35 AM   Modules accepted: Level of Service

## 2014-07-28 ENCOUNTER — Other Ambulatory Visit: Payer: Self-pay | Admitting: Obstetrics

## 2014-10-22 ENCOUNTER — Ambulatory Visit: Payer: Medicare Other | Admitting: Obstetrics

## 2015-01-25 ENCOUNTER — Ambulatory Visit (INDEPENDENT_AMBULATORY_CARE_PROVIDER_SITE_OTHER): Payer: Medicare Other | Admitting: Obstetrics

## 2015-01-25 ENCOUNTER — Encounter: Payer: Self-pay | Admitting: Obstetrics

## 2015-01-25 VITALS — BP 163/95 | HR 68 | Temp 98.7°F | Wt 186.0 lb

## 2015-01-25 DIAGNOSIS — N760 Acute vaginitis: Secondary | ICD-10-CM

## 2015-01-25 DIAGNOSIS — Z01419 Encounter for gynecological examination (general) (routine) without abnormal findings: Secondary | ICD-10-CM

## 2015-01-25 DIAGNOSIS — B3731 Acute candidiasis of vulva and vagina: Secondary | ICD-10-CM

## 2015-01-25 DIAGNOSIS — B373 Candidiasis of vulva and vagina: Secondary | ICD-10-CM | POA: Diagnosis not present

## 2015-01-25 DIAGNOSIS — A499 Bacterial infection, unspecified: Secondary | ICD-10-CM | POA: Diagnosis not present

## 2015-01-25 DIAGNOSIS — L732 Hidradenitis suppurativa: Secondary | ICD-10-CM | POA: Diagnosis not present

## 2015-01-25 DIAGNOSIS — Z124 Encounter for screening for malignant neoplasm of cervix: Secondary | ICD-10-CM | POA: Diagnosis not present

## 2015-01-25 DIAGNOSIS — B9689 Other specified bacterial agents as the cause of diseases classified elsewhere: Secondary | ICD-10-CM

## 2015-01-25 MED ORDER — FLUCONAZOLE 150 MG PO TABS: 150.0000 mg | ORAL_TABLET | Freq: Once | ORAL | Status: DC

## 2015-01-25 MED ORDER — METRONIDAZOLE 500 MG PO TABS: 500.0000 mg | ORAL_TABLET | Freq: Two times a day (BID) | ORAL | Status: DC

## 2015-01-25 NOTE — Progress Notes (Signed)
Subjective:        Christy Huang is a 34 y.o. female here for a routine exam.  Current complaints: malodorous vaginal discharge and vaginal itching.    Personal health questionnaire:  Is patient [name], have a family history of breast and/or ovarian cancer: no Is there a family history of uterine cancer diagnosed at age < 33, gastrointestinal cancer, urinary tract cancer, family member who is a Personnel officer syndrome-associated carrier: no Is the patient overweight and hypertensive, family history of diabetes, personal history of gestational diabetes, preeclampsia or PCOS: no Is patient over 34, have PCOS,  family history of premature CHD under age 60, diabetes, smoke, have hypertension or peripheral artery disease:  no At any time, has a partner hit, kicked or otherwise hurt or frightened you?: no Over the past 2 weeks, have you felt down, depressed or hopeless?: no Over the past 2 weeks, have you felt little interest or pleasure in doing things?:no   Gynecologic History Patient's last menstrual period was 12/27/2014. Contraception: condoms Last Pap: 2014. Results were: normal Last mammogram: n/a. Results were: n/a  Obstetric History OB History  No data available    Past Medical History  Diagnosis Date  . Hypertension   . Acne     History reviewed. No pertinent past surgical history.   Current outpatient prescriptions:  .  caffeine 200 MG TABS tablet, Take 200 mg by mouth every 4 (four) hours as needed., Disp: , Rfl:  .  clindamycin (CLEOCIN-T) 1 % external solution, Apply topically 2 (two) times daily., Disp: 60 mL, Rfl: prn .  doxycycline (VIBRA-TABS) 100 MG tablet, TAKE 1 TABLET BY MOUTH TWICE A DAY, Disp: 60 tablet, Rfl: 5 .  fluconazole (DIFLUCAN) 150 MG tablet, Take 1 tablet (150 mg total) by mouth once., Disp: 1 tablet, Rfl: 4 .  metroNIDAZOLE (FLAGYL) 500 MG tablet, Take 1 tablet (500 mg total) by mouth 2 (two) times daily., Disp: 14 tablet, Rfl: 4 .   prenatal vitamin w/FE, FA (PRENATAL 1 + 1) 27-1 MG TABS tablet, Take 1 tablet by mouth daily at 12 noon., Disp: , Rfl:  .  triamterene-hydrochlorothiazide (DYAZIDE) 37.5-25 MG per capsule, Take 1 each (1 capsule total) by mouth every morning. (Patient not taking: Reported on 01/25/2015), Disp: 30 capsule, Rfl: 7 Allergies  Allergen Reactions  . Sulfa Antibiotics     History  Substance Use Topics  . Smoking status: Current Some Day Smoker -- 0.20 packs/day    Types: Cigarettes  . Smokeless tobacco: Not on file  . Alcohol Use: No    Family History  Problem Relation Age of Onset  . Diabetes Mother   . Hypertension Mother   . Diabetes Maternal Grandmother   . Hypertension Maternal Grandmother       Review of Systems  Constitutional: negative for fatigue and weight loss Respiratory: negative for cough and wheezing Cardiovascular: negative for chest pain, fatigue and palpitations Gastrointestinal: negative for abdominal pain and change in bowel habits Musculoskeletal:negative for myalgias Neurological: negative for gait problems and tremors Behavioral/Psych: negative for abusive relationship, depression Endocrine: negative for temperature intolerance   Genitourinary: positive for malodorous vaginal discharge Integument/breast: negative for breast lump, breast tenderness, nipple discharge and skin lesion(s)    Objective:       BP 163/95 mmHg  Pulse 68  Temp(Src) 98.7 F (37.1 C)  Wt 186 lb (84.369 kg)  LMP 12/27/2014 General:   alert  Skin:   no rash or abnormalities  Lungs:  clear to auscultation bilaterally  Heart:   regular rate and rhythm, S1, S2 normal, no murmur, click, rub or gallop  Breasts:   normal without suspicious masses, skin or nipple changes or axillary nodes  Abdomen:  normal findings: no organomegaly, soft, non-tender and no hernia  Pelvis:  External genitalia: normal general appearance Urinary system: urethral meatus normal and bladder without fullness,  nontender Vaginal: normal without tenderness, induration or masses Cervix: normal appearance Adnexa: normal bimanual exam Uterus: anteverted and non-tender, normal size   Lab Review Urine pregnancy test Labs reviewed yes Radiologic studies reviewed no    Assessment:    Healthy female exam.    Vaginal discharge with odor and irritation.  Probable BV / Yeast Hidradenitis.  Stable on Clindamycin cream prn   Plan:    Education reviewed: calcium supplements, low fat, low cholesterol diet, safe sex/STD prevention, self breast exams, smoking cessation and weight bearing exercise. Contraception: condoms. Follow up in: 1 year.   Meds ordered this encounter  Medications  . fluconazole (DIFLUCAN) 150 MG tablet    Sig: Take 1 tablet (150 mg total) by mouth once.    Dispense:  1 tablet    Refill:  4  . metroNIDAZOLE (FLAGYL) 500 MG tablet    Sig: Take 1 tablet (500 mg total) by mouth 2 (two) times daily.    Dispense:  14 tablet    Refill:  4   No orders of the defined types were placed in this encounter.

## 2015-01-26 DIAGNOSIS — L732 Hidradenitis suppurativa: Secondary | ICD-10-CM | POA: Diagnosis not present

## 2015-01-26 DIAGNOSIS — Z01419 Encounter for gynecological examination (general) (routine) without abnormal findings: Secondary | ICD-10-CM | POA: Diagnosis not present

## 2015-01-26 DIAGNOSIS — B373 Candidiasis of vulva and vagina: Secondary | ICD-10-CM | POA: Diagnosis not present

## 2015-01-26 DIAGNOSIS — N760 Acute vaginitis: Secondary | ICD-10-CM | POA: Diagnosis not present

## 2015-01-26 DIAGNOSIS — Z124 Encounter for screening for malignant neoplasm of cervix: Secondary | ICD-10-CM | POA: Diagnosis not present

## 2015-01-26 DIAGNOSIS — A499 Bacterial infection, unspecified: Secondary | ICD-10-CM | POA: Diagnosis not present

## 2015-01-26 NOTE — Addendum Note (Signed)
Addended by: Henriette Combs on: 01/26/2015 12:00 PM   Modules accepted: Orders

## 2015-01-26 NOTE — Addendum Note (Signed)
Addended by: Henriette Combs on: 01/26/2015 09:21 AM   Modules accepted: Orders

## 2015-01-27 LAB — PAP IG (IMAGE GUIDED)

## 2015-01-28 ENCOUNTER — Other Ambulatory Visit: Payer: Self-pay | Admitting: Obstetrics

## 2015-01-28 LAB — SURESWAB, VAGINOSIS/VAGINITIS PLUS
Atopobium vaginae: NOT DETECTED Log (cells/mL)
C. ALBICANS, DNA: DETECTED — AB
C. GLABRATA, DNA: NOT DETECTED
C. PARAPSILOSIS, DNA: NOT DETECTED
C. trachomatis RNA, TMA: NOT DETECTED
C. tropicalis, DNA: NOT DETECTED
GARDNERELLA VAGINALIS: NOT DETECTED Log (cells/mL)
LACTOBACILLUS SPECIES: 7.5 Log (cells/mL)
MEGASPHAERA SPECIES: NOT DETECTED Log (cells/mL)
N. GONORRHOEAE RNA, TMA: NOT DETECTED
T. VAGINALIS RNA, QL TMA: NOT DETECTED

## 2015-03-31 ENCOUNTER — Other Ambulatory Visit: Payer: Self-pay | Admitting: Obstetrics

## 2015-03-31 ENCOUNTER — Telehealth: Payer: Self-pay | Admitting: *Deleted

## 2015-03-31 NOTE — Telephone Encounter (Signed)
Pt called to office requesting refill on her BP medication. In review of chart, Dr Clearance Coots has sent refill to pharmacy.  Pt has been made aware.

## 2015-06-10 ENCOUNTER — Other Ambulatory Visit: Payer: Self-pay | Admitting: Obstetrics

## 2015-06-21 ENCOUNTER — Encounter (HOSPITAL_COMMUNITY): Payer: Self-pay | Admitting: *Deleted

## 2015-06-21 ENCOUNTER — Emergency Department (HOSPITAL_COMMUNITY)
Admission: EM | Admit: 2015-06-21 | Discharge: 2015-06-21 | Disposition: A | Discharge status: Home / Self Care | Payer: Medicare Other | Attending: Emergency Medicine | Admitting: Emergency Medicine

## 2015-06-21 DIAGNOSIS — I10 Essential (primary) hypertension: Secondary | ICD-10-CM | POA: Diagnosis not present

## 2015-06-21 DIAGNOSIS — R196 Halitosis: Secondary | ICD-10-CM

## 2015-06-21 DIAGNOSIS — Z872 Personal history of diseases of the skin and subcutaneous tissue: Secondary | ICD-10-CM | POA: Insufficient documentation

## 2015-06-21 DIAGNOSIS — F17210 Nicotine dependence, cigarettes, uncomplicated: Secondary | ICD-10-CM | POA: Diagnosis not present

## 2015-06-21 DIAGNOSIS — Z792 Long term (current) use of antibiotics: Secondary | ICD-10-CM | POA: Insufficient documentation

## 2015-06-21 DIAGNOSIS — J029 Acute pharyngitis, unspecified: Secondary | ICD-10-CM

## 2015-06-21 DIAGNOSIS — Z79899 Other long term (current) drug therapy: Secondary | ICD-10-CM | POA: Diagnosis not present

## 2015-06-21 LAB — RAPID STREP SCREEN (MED CTR MEBANE ONLY): Streptococcus, Group A Screen (Direct): NEGATIVE

## 2015-06-21 NOTE — ED Notes (Signed)
Pt reports x2 days ago she coughed up a "white ball" - pt state she looked up "tonsil stones" and states she has the symptoms of "tonsil stones" - pt admits to a sore throat and hoarseness for a few months as well. Pt has used salt water gargles and home remedies w/ some relief. Denies fever.

## 2015-06-21 NOTE — ED Notes (Signed)
Declined W/C at D/C and was escorted to lobby by RN. 

## 2015-06-21 NOTE — ED Provider Notes (Signed)
CSN: [id number]     Arrival date & time 06/21/15  1043 History  By signing my name below, I, Doreatha Martin, attest that this documentation has been prepared under the direction and in the presence of [employer], PA-C.  Electronically Signed: Doreatha Martin, ED Scribe. 06/21/2015. 12:01 PM.     Chief Complaint  Patient presents with  . Sore Throat   The history is provided by the patient. No language interpreter was used.    HPI Comments: Christy Huang is a 34 y.o. female with h/o HTN who presents to the Emergency Department complaining of moderate right-sided sore throat onset 2 days ago. Pt states associated halitosis, productive cough, hoarseness. She reports that her symptoms began with the halitosis followed by the hoarseness and sore throat. Pt states that 2 days ago, she coughed up a sebum ball. She states she has tried apple cider vinegar with honey, salt water rinses with no relief of sore throat or cough. Pt states she takes Doxycycline regularly for boils. Pt denies fever, trismus, dysphagia.    Past Medical History  Diagnosis Date  . Hypertension   . Acne    History reviewed. No pertinent past surgical history. Family History  Problem Relation Age of Onset  . Diabetes Mother   . Hypertension Mother   . Diabetes Maternal Grandmother   . Hypertension Maternal Grandmother    Social History  Substance Use Topics  . Smoking status: Current Some Day Smoker -- 0.20 packs/day    Types: Cigarettes  . Smokeless tobacco: None  . Alcohol Use: No   OB History    No data available     Review of Systems  Constitutional: Negative for fever.  HENT: Positive for sore throat.   Respiratory: Positive for cough.   All other systems reviewed and are negative.  Allergies  Sulfa antibiotics  Home Medications   Prior to Admission medications   Medication Sig Start Date End Date Taking? Authorizing Provider  caffeine 200 MG TABS tablet Take 200 mg by mouth every 4 (four)  hours as needed.    Historical Provider, MD  clindamycin (CLEOCIN T) 1 % external solution APPLY TOPICALLY 2 (TWO) TIMES DAILY. 06/11/15   Brock Bad, MD  clindamycin (CLEOCIN-T) 1 % external solution Apply topically 2 (two) times daily. 07/22/14   Brock Bad, MD  doxycycline (VIBRA-TABS) 100 MG tablet TAKE 1 TABLET BY MOUTH TWICE A DAY 06/15/14   Brock Bad, MD  fluconazole (DIFLUCAN) 150 MG tablet Take 1 tablet (150 mg total) by mouth once. 01/25/15   Brock Bad, MD  metroNIDAZOLE (FLAGYL) 500 MG tablet Take 1 tablet (500 mg total) by mouth 2 (two) times daily. 01/25/15   Brock Bad, MD  prenatal vitamin w/FE, FA (PRENATAL 1 + 1) 27-1 MG TABS tablet Take 1 tablet by mouth daily at 12 noon.    Historical Provider, MD  triamterene-hydrochlorothiazide (DYAZIDE) 37.5-25 MG capsule TAKE 1 EACH (1 CAPSULE TOTAL) BY MOUTH EVERY MORNING. 03/31/15   Brock Bad, MD   BP 135/97 mmHg  Pulse 106  Temp(Src) 98.5 F (36.9 C) (Oral)  Resp 20  SpO2 99%  LMP 05/29/2015 (Within Days) Physical Exam  Constitutional: She is oriented to person, place, and time. She appears well-developed and well-nourished. No distress.  HENT:  Head: Normocephalic and atraumatic.  Mouth/Throat: Uvula is midline and oropharynx is clear and moist. No trismus in the jaw. No uvula swelling. No oropharyngeal exudate, posterior oropharyngeal edema,  posterior oropharyngeal erythema or tonsillar abscesses.  Tonsoliths seen. No exudate. Uvula midline. No sign of PTA. No swelling under tongue.  Hoarseness present.  Eyes: Conjunctivae are normal. Right eye exhibits no discharge. Left eye exhibits no discharge. No scleral icterus.  Neck: Neck supple. No tracheal deviation present.  Cardiovascular: Normal rate.   Pulmonary/Chest: Effort normal.  Lymphadenopathy:    She has no cervical adenopathy.  Neurological: She is alert and oriented to person, place, and time. Coordination normal.  Skin: Skin is warm  and dry. No rash noted. She is not diaphoretic. No erythema. No pallor.  Psychiatric: She has a normal mood and affect. Her behavior is normal.  Nursing note and vitals reviewed.  ED Course  Procedures (including critical care time) DIAGNOSTIC STUDIES: Oxygen Saturation is 99% on RA, normal by my interpretation.    COORDINATION OF CARE: 11:56 AM Discussed treatment plan with pt at bedside which includes rapid strep test and pt agreed to plan.   Labs Review Labs Reviewed  RAPID STREP SCREEN (NOT AT [facility])  CULTURE, GROUP A STREP    I have personally reviewed and evaluated these lab results as part of my medical decision-making.   MDM   Final diagnoses:  Viral pharyngitis  Halitosis   Pt with negative strep. Diagnosis of viral pharyngitis and halitosis. No abx indicated at this time. Discussed that results of strep culture are pending and patient will be informed if positive result and abx will be called in at that time. Discharge with symptomatic tx of pharyngitis. Tonsoliths present on exam, likely etiology of halitosis. Instructions for conservative treatment given. Referral to ENT given if symptoms persist. No evidence of dehydration. Pt is tolerating secretions. Presentation not concerning for peritonsillar abscess or spread of infection to deep spaces of the throat; patent airway. Specific return precautions discussed. Recommended PCP follow up. Pt appears safe for discharge.   I personally performed the services described in this documentation, which was scribed in my presence. The recorded information has been reviewed and is accurate.     Lester KinsmanSamantha Tripp [place], PA-C 06/21/15 1322  Linwood DibblesJon Knapp, MD 06/22/15 [phone]

## 2015-06-21 NOTE — Discharge Instructions (Signed)
Halitosis Halitosis is bad breath. Halitosis may be caused by:  Foods and drinks that you ingest.  Poor oral hygiene.  Medical conditions, such as sinus infections, mouth infections, and diabetes.  Medicines that dry out your mouth.  Smoking. HOME CARE INSTRUCTIONS  Practice good oral hygiene. Do this by:  Flossing every day. Ask your dentist to show you the best way to floss.  Brushing your teeth at least two times each day using toothpaste that is recommended by your dentist. Ask your dentist to show you the best way to brush your teeth.  Brushing your tongue when you brush your teeth. This may help to improve your breath.  Rinsing your mouth one time each day using mouthwash that is recommended by your dentist.  Scheduling and attending regular dental appointments.  Drink enough water to keep your urine clear or pale yellow.  Eat foods that help to keep your teeth clean, such as carrots and celery.  Avoid foods and drinks that can lead to bad breath, such as:  Garlic.  Onions.  Fish.  Coffee.  Alcohol.  Horseradish.  Red meat.  Do not use any tobacco products, including cigarettes, chewing tobacco, or electronic cigarettes. If you need help quitting, ask your health care provider.  Make sure that any mouth devices that you wear, such as a retainer or dentures, are worn and cleaned properly.  If you have a dry mouth, try chewing gum or mints that do not contain sugar. SEEK MEDICAL CARE IF:  You have new symptoms.  Your symptoms get worse or they do not improv Pharyngitis Pharyngitis is a sore throat (pharynx). There is redness, pain, and swelling of your throat. HOME CARE  Drink enough fluids to keep your pee (urine) clear or pale yellow. Only take medicine as told by your doctor. You may get sick again if you do not take medicine as told. Finish your medicines, even if you start to feel better. Do not take aspirin. Rest. Rinse your mouth (gargle)  with salt water ( tsp of salt per 1 qt of water) every 1-2 hours. This will help the pain. If you are not at risk for choking, you can suck on hard candy or sore throat lozenges. GET HELP IF: You have large, tender lumps on your neck. You have a rash. You cough up green, yellow-brown, or bloody spit. GET HELP RIGHT AWAY IF:  You have a stiff neck. You drool or cannot swallow liquids. You throw up (vomit) or are not able to keep medicine or liquids down. You have very bad pain that does not go away with medicine. You have problems breathing (not from a stuffy nose). MAKE SURE YOU:  Understand these instructions. Will watch your condition. Will get help right away if you are not doing well or get worse.   This information is not intended to replace advice given to you by your health care provider. Make sure you discuss any questions you have with your health care provider.   Document Released: 11/29/2007 Document Revised: 04/02/2013 Document Reviewed: 02/17/2013 Elsevier Interactive Patient Education [employer]  e with home care.   This information is not intended to replace advice given to you by your health care provider. Make sure you discuss any questions you have with your health care provider.  Follow up with ENT if symptoms do not improve. See above for recommendations to improve breath. Return to the emergency department if you Experience difficulty breathing, difficulty swelling, severe increasing in  your pain, fever.

## 2015-06-22 ENCOUNTER — Telehealth: Payer: Self-pay | Admitting: Obstetrics

## 2015-06-22 NOTE — Telephone Encounter (Signed)
Pt needs an ENT referral. Pt went to ER with tonsil problem.Dr. Clearance CootsHarper is on ins card.  Christy Huang

## 2015-06-23 ENCOUNTER — Telehealth: Payer: Self-pay

## 2015-06-23 ENCOUNTER — Other Ambulatory Visit: Payer: Self-pay | Admitting: Obstetrics

## 2015-06-23 DIAGNOSIS — J039 Acute tonsillitis, unspecified: Secondary | ICD-10-CM

## 2015-06-23 NOTE — Telephone Encounter (Signed)
SPOKE WITH PATIENT CONCERNING HER APPT WITH [facility], NOSE [facility]RO[facility] ON 08/04/14 AT 2:30PM WITH DR, Jearld FentonBYERS - SENT NOTES TO THEM INCLUDING ED NOTES

## 2015-06-24 LAB — CULTURE, GROUP A STREP: Strep A Culture: NEGATIVE

## 2015-07-05 ENCOUNTER — Other Ambulatory Visit: Payer: Self-pay | Admitting: Obstetrics

## 2015-07-07 DIAGNOSIS — J358 Other chronic diseases of tonsils and adenoids: Secondary | ICD-10-CM | POA: Diagnosis not present

## 2015-07-07 DIAGNOSIS — R490 Dysphonia: Secondary | ICD-10-CM | POA: Diagnosis not present

## 2015-07-07 DIAGNOSIS — K219 Gastro-esophageal reflux disease without esophagitis: Secondary | ICD-10-CM | POA: Diagnosis not present

## 2015-07-26 ENCOUNTER — Other Ambulatory Visit: Payer: Self-pay | Admitting: Obstetrics

## 2015-07-27 ENCOUNTER — Telehealth: Payer: Self-pay | Admitting: *Deleted

## 2015-07-27 NOTE — Telephone Encounter (Signed)
Patient is requesting a refill of her Doxycycline be sent to the [facility].

## 2015-07-29 NOTE — Telephone Encounter (Signed)
No antibiotic indicated, per ER evaluation.

## 2015-08-10 ENCOUNTER — Ambulatory Visit (INDEPENDENT_AMBULATORY_CARE_PROVIDER_SITE_OTHER): Payer: Medicare Other | Admitting: *Deleted

## 2015-08-10 DIAGNOSIS — Z23 Encounter for immunization: Secondary | ICD-10-CM | POA: Diagnosis not present

## 2015-09-15 ENCOUNTER — Other Ambulatory Visit: Payer: Self-pay | Admitting: Obstetrics

## 2015-09-17 NOTE — Progress Notes (Signed)
Patient in office for a flu shot shot. Patient tolerated injection well.

## 2016-01-19 ENCOUNTER — Other Ambulatory Visit: Payer: Self-pay | Admitting: Obstetrics

## 2016-03-15 ENCOUNTER — Ambulatory Visit (INDEPENDENT_AMBULATORY_CARE_PROVIDER_SITE_OTHER): Payer: Medicare Other | Admitting: Obstetrics

## 2016-03-15 VITALS — BP 126/92 | HR 77 | Temp 98.5°F | Ht 62.0 in | Wt 213.0 lb

## 2016-03-15 DIAGNOSIS — E669 Obesity, unspecified: Secondary | ICD-10-CM

## 2016-03-15 DIAGNOSIS — Z01419 Encounter for gynecological examination (general) (routine) without abnormal findings: Secondary | ICD-10-CM

## 2016-03-15 DIAGNOSIS — L732 Hidradenitis suppurativa: Secondary | ICD-10-CM

## 2016-03-15 DIAGNOSIS — Z124 Encounter for screening for malignant neoplasm of cervix: Secondary | ICD-10-CM | POA: Diagnosis not present

## 2016-03-15 DIAGNOSIS — B3731 Acute candidiasis of vulva and vagina: Secondary | ICD-10-CM

## 2016-03-15 DIAGNOSIS — I10 Essential (primary) hypertension: Secondary | ICD-10-CM

## 2016-03-15 DIAGNOSIS — B373 Candidiasis of vulva and vagina: Secondary | ICD-10-CM | POA: Diagnosis not present

## 2016-03-15 MED ORDER — CARVEDILOL 12.5 MG PO TABS: 12.5000 mg | ORAL_TABLET | Freq: Two times a day (BID) | ORAL | 11 refills | Status: DC

## 2016-03-15 MED ORDER — FLUCONAZOLE 150 MG PO TABS: 150.0000 mg | ORAL_TABLET | Freq: Once | ORAL | 2 refills | Status: AC

## 2016-03-15 MED ORDER — CLINDAMYCIN PHOSPHATE 1 % EX SOLN: CUTANEOUS | 5 refills | Status: DC

## 2016-03-15 MED ORDER — TERCONAZOLE 0.4 % VA CREA: 1.0000 | TOPICAL_CREAM | Freq: Every day | VAGINAL | 2 refills | Status: DC

## 2016-03-15 MED ORDER — TRIAMTERENE-HCTZ 37.5-25 MG PO CAPS: ORAL_CAPSULE | ORAL | 11 refills | Status: DC

## 2016-03-15 NOTE — Progress Notes (Signed)
Subjective:        Christy Huang is a 35 y.o. female here for a routine exam.  Current complaints: Weight gain.  Concerned about elevated BP and chronic Hidradenitis.  Personal health questionnaire:  Is patient Christy Huang, have a family history of breast and/or ovarian cancer: no Is there a family history of uterine cancer diagnosed at age < 5750, gastrointestinal cancer, urinary tract cancer, family member who is a Personnel officerLynch syndrome-associated carrier: no Is the patient overweight and hypertensive, family history of diabetes, personal history of gestational diabetes, preeclampsia or PCOS: no Is patient over 4355, have PCOS,  family history of premature CHD under age 35, diabetes, smoke, have hypertension or peripheral artery disease:  no At any time, has a partner hit, kicked or otherwise hurt or frightened you?: no Over the past 2 weeks, have you felt down, depressed or hopeless?: no Over the past 2 weeks, have you felt little interest or pleasure in doing things?:no   Gynecologic History No LMP recorded. Contraception: condoms Last Pap: 2014. Results were: normal Last mammogram: n/a. Results were: n/a  Obstetric History OB History  No data available    Past Medical History:  Diagnosis Date  . Acne   . Hypertension     No past surgical history on file.   Current Outpatient Prescriptions:  .  Biotin 9811910000 MCG TABS, Take by mouth., Disp: , Rfl:  .  clindamycin (CLEOCIN T) 1 % external solution, APPLY TOPICALLY 2 (TWO) TIMES DAILY., Disp: 60 mL, Rfl: 0 .  doxycycline (VIBRA-TABS) 100 MG tablet, TAKE 1 TABLET BY MOUTH TWICE A DAY, Disp: 60 tablet, Rfl: 3 .  Ginkgo Biloba 40 MG TABS, Take by mouth., Disp: , Rfl:  .  Omega-3 Fatty Acids (FISH OIL) 1000 MG CAPS, Take by mouth., Disp: , Rfl:  .  omeprazole (PRILOSEC) 20 MG capsule, Take 20 mg by mouth daily., Disp: , Rfl:  .  triamterene-hydrochlorothiazide (DYAZIDE) 37.5-25 MG capsule, TAKE 1 EACH (1 CAPSULE TOTAL) BY MOUTH  EVERY MORNING., Disp: 30 capsule, Rfl: 7 .  prenatal vitamin w/FE, FA (PRENATAL 1 + 1) 27-1 MG TABS tablet, Take 1 tablet by mouth daily at 12 noon., Disp: , Rfl:  Allergies  Allergen Reactions  . Sulfa Antibiotics     Social History  Substance Use Topics  . Smoking status: Current Some Day Smoker    Packs/day: 0.20    Types: Cigarettes  . Smokeless tobacco: Not on file  . Alcohol use No    Family History  Problem Relation Age of Onset  . Diabetes Mother   . Hypertension Mother   . Diabetes Maternal Grandmother   . Hypertension Maternal Grandmother       Review of Systems  Constitutional: positive for weight gain Respiratory: negative for cough and wheezing Cardiovascular: negative for chest pain, fatigue and palpitations Gastrointestinal: negative for abdominal pain and change in bowel habits Musculoskeletal:negative for myalgias Neurological: negative for gait problems and tremors Behavioral/Psych: negative for abusive relationship, depression Endocrine: negative for temperature intolerance   Genitourinary:negative for abnormal menstrual periods, genital lesions, hot flashes, sexual problems and vaginal discharge Integument/breast: negative for breast lump, breast tenderness, nipple discharge and skin lesion(s)    Objective:       BP (!) 126/92   Pulse 77   Temp 98.5 F (36.9 C)   Ht 5\' 2"  (1.575 m)   Wt 213 lb (96.6 kg)   BMI 38.96 kg/m  General:   alert  Skin:  folliculitis axillaris  Lungs:   clear to auscultation bilaterally  Heart:   regular rate and rhythm, S1, S2 normal, no murmur, click, rub or gallop  Breasts:   normal without suspicious masses, skin or nipple changes or axillary nodes  Abdomen:  normal findings: no organomegaly, soft, non-tender and no hernia  Pelvis:  External genitalia: normal general appearance Urinary system: urethral meatus normal and bladder without fullness, nontender Vaginal: normal without tenderness, induration or  masses Cervix: normal appearance Adnexa: normal bimanual exam Uterus: anteverted and non-tender, normal size   Lab Review Urine pregnancy test Labs reviewed yes Radiologic studies reviewed no  50% of 20 min visit spent on counseling and coordination of care.   Assessment:    Healthy female exam.    Hidradenitis axillaris  Obesity Hypertension    Plan:    Referred to General Surgery for evaluation  Referred to Internal Medicine for Hypertension   Education reviewed: low fat, low cholesterol diet, safe sex/STD prevention and weight bearing exercise. Follow up in: 1 year.   Meds ordered this encounter  Medications  . omeprazole (PRILOSEC) 20 MG capsule    Sig: Take 20 mg by mouth daily.  . Omega-3 Fatty Acids (FISH OIL) 1000 MG CAPS    Sig: Take by mouth.  . Biotin 16109 MCG TABS    Sig: Take by mouth.  . Ginkgo Biloba 40 MG TABS    Sig: Take by mouth.   No orders of the defined types were placed in this encounter.    Patient ID: Christy Huang, female   DOB: 1980-08-23, 35 y.o.   MRN: [id number]

## 2016-03-16 ENCOUNTER — Encounter: Payer: Self-pay | Admitting: Obstetrics

## 2016-03-19 LAB — NUSWAB BV AND CANDIDA, NAA
CANDIDA ALBICANS, NAA: POSITIVE — AB
CANDIDA GLABRATA, NAA: NEGATIVE

## 2016-03-20 ENCOUNTER — Other Ambulatory Visit: Payer: Self-pay | Admitting: Obstetrics

## 2016-03-30 ENCOUNTER — Ambulatory Visit: Payer: Self-pay | Admitting: Surgery

## 2016-03-30 DIAGNOSIS — L732 Hidradenitis suppurativa: Secondary | ICD-10-CM | POA: Diagnosis not present

## 2016-03-30 NOTE — H&P (Signed)
Christy Huang 03/30/2016 11:22 AM Location: [facility] Patient #: [id number] DOB: Aug 22, 1980 Single / Language: English / Race: Black or African American Female  History of Present Illness (Woodruff Skirvin A. Lareta Bruneau MD; 03/30/2016 11:39 AM) Patient words: This is a very nice 35 year old woman with a history of obesity and hypertension who presents with a long-standing history of hidradenitis of bilateral axilla in the gluteal crease. She has been dealing with it for many years and has tried numerous remedies-she no longer states that region, she does not use any chemical the appearance, she uses doxycycline PO and clindamycin ointment, and she has also tried her remedies such as drinking water with vinegar, and garlic. She is extremely fresh treated by process and is eager to have a more definitive solution. Currently only the right side is inflamed/draining.  She works as a CNA. She lives at home with her children. She does not use tobacco products or alcohol but occasionally uses marijuana. She goes to the gym nearly every day as she is also trying to lose weight.  The patient is a 35 year old female.   Other Problems (Handsome Anglin A Joshau Code, MD; 03/30/2016 11:45 AM) Gastroesophageal Reflux Disease High blood pressure  Past Surgical History (Mintie Witherington A Arianis Bowditch, MD; 03/30/2016 11:45 AM) No pertinent past surgical history  Diagnostic Studies History (Laqueena Hinchey A Anjuli Gemmill, MD; 03/30/2016 11:45 AM) Colonoscopy never Mammogram never Pap Smear 1-5 years ago  Allergies (Ashley Beck, CMA; 03/30/2016 11:23 AM) Sulfa 10 *OPHTHALMIC AGENTS*  Medication History (Ashley Beck, CMA; 03/30/2016 11:24 AM) Biotin (<MEASUREHarrah's EntertaAnastasio AueOswaldo MarcWeMarland KitchensTF27a469DoClovMarland Kitchener [phone] Kitchener MeaElTFab<Harra58h609-2354DoClovMarland Kitchener Mea98E(765)159DoClovMarland Kitchener M38e760-2235DoClovMarland Kitchener MeaElTFab<Harrah50'775 232DoClovMarland Kitchener Me75a939-1861DoClovMarland Kitchener MeaElTFab<MEASUREMEHarrah's EntertaAnastasio AueOswaldo MarcWeMarland KitchensTFab<72H([phone])160DoClovMarland Kitchener MeaElTFab<M22E332-65DoClovMarland Kitchener MeaElTFab<MEASUREMEHarrah's EntertainAdvAnastasio AuerbaWashington Dc Va Medical CeOswaWesley BlaMar68l([phone])73DoClovMarland Kitchener MeaElTFab<Harra45h(309) 12349-9[place]over MeaEl54e915-024DoClovMarland Kitchener MeaElTFab<H10a754-306DoClovMarland Kitchener MeaElTF56a309-78DoClovMarland Kitchener Mea73E(848) 273DoClovMarland Kitchener MeaElTFab<Harrah's Ent23e432-837DoClovMarland Kitchener MeaElTFab<Harrah's EntertainAdvAnastasio Auerb47a541-551DoClovMarland Kitchener MeaElTFab<MEA76S210 664DoClovMarland Kitchener MeaElTFab<Harrah77'262 6252DoClovMarland Kitchener57 ([phone])732DoClovMarland Kitchene60r754-0410DoClovMarland Kitchener MeaElTFabienne Brunsoniot Dallyh's EntertainAdvAnastasio AuerbaSutter Coast HospOswaWesley BlaTrilby L foreDEID_[id number]$  Tablet, Oral) Active. Clindamycin Phosphate (1% Solution, External) Active. Doxycycline Hyclate (100MG  Tablet, Oral) Active. Ginkoba (40MG  Tablet, Oral) Active. Omega 3 (1000MG  Capsule, Oral) Active. Omeprazole (20MG  Capsule DR, Oral)  Active. Terconazole (0.4% Cream, Vaginal) Active. Triamterene-HCTZ (37.5-25MG  Capsule, Oral) Active. Medications Reconciled  Social History (Damauri Minion A Jabron Weese, MD; 03/30/2016 11:45 AM) Caffeine use Coffee. No alcohol use No drug use Tobacco use Never smoker.  Family History (Cornel Werber A Briar Sword, MD; 03/30/2016 11:45 AM) Diabetes Mellitus Mother. Heart Disease Mother. Hypertension Mother. Kidney Disease Mother.  Pregnancy / Birth History (Roberth Berling A Ashtan Girtman, MD; 03/30/2016 11:45 AM) Age at menarche 11 years. Gravida 6 Length (months) of breastfeeding 7-12 Maternal age 30-20 Para 3 Regular periods     Review of Systems (Alexa Blish A. Petra Dumler MD; 03/30/2016 11:45 AM) General Not Present- Appetite Loss, Chills, Fatigue, Fever, Night Sweats, Weight Gain and Weight Loss. Skin Not Present- Change in Wart/Mole, Dryness, Hives, Jaundice, New Lesions, Non-Healing Wounds, Rash and Ulcer. HEENT Not Present- Earache, Hearing Loss, Hoarseness, Nose Bleed, Oral Ulcers, Ringing in the Ears, Seasonal Allergies, Sinus Pain, Sore Throat, Visual Disturbances, Wears glasses/contact lenses and Yellow Eyes. Respiratory Not Present- Bloody sputum, Chronic Cough, Difficulty Breathing, Snoring and Wheezing. Breast Not Present- Breast Mass, Breast Pain, Nipple Discharge and Skin Changes. Cardiovascular Not Present- Chest Pain, Difficulty Breathing Lying Down, Leg Cramps, Palpitations, Rapid Heart Rate, Shortness of Breath and Swelling of Extremities. Gastrointestinal Not Present- Abdominal Pain, Bloating, Bloody Stool, Change in Bowel Habits, Chronic diarrhea, Constipation, Difficulty Swallowing, Excessive gas, Gets full quickly at meals, Hemorrhoids, Indigestion, Nausea, Rectal Pain and Vomiting. Female Genitourinary Not Present- Frequency, Nocturia, Painful Urination, Pelvic Pain and Urgency. Musculoskeletal Not Present- Back Pain, Joint Pain, Joint Stiffness, Muscle Pain, Muscle Weakness and Swelling  of Extremities. Neurological Not Present- Decreased Memory, Fainting, Headaches, Numbness, Seizures, Tingling, Tremor, Trouble walking and Weakness. Psychiatric Not Present- Anxiety, Bipolar, Change in Sleep Pattern, Depression, Fearful and  Frequent crying. Endocrine Not Present- Cold Intolerance, Excessive Hunger, Hair Changes, Heat Intolerance, Hot flashes and New Diabetes. Hematology Not Present- Blood Thinners, Easy Bruising, Excessive bleeding, Gland problems, HIV and Persistent Infections.  Vitals Fay Records(Ashley Beck CMA; 03/30/2016 11:24 AM) 03/30/2016 11:24 AM Weight: 210 lb Height: 62in Body Surface Area: 1.95 m Body Mass Index: 38.41 kg/m  Temp.: 98.81F  Pulse: 84 (Regular)  BP: 130/70 (Sitting, Left Arm, Standard)      Physical Exam (Brandy Zuba A. Fredricka Bonineonnor MD; 03/30/2016 11:41 AM)  The physical exam findings are as follows: Note:She is alert, oriented, no distress, obese female Extraocular motions intact, anicteric, pupils equally round and reactive, moist mucous membranes Neck without mass or thyromegaly Unlabored respirations, clear bilaterally, regular rate and rhythm with probable distal pulses Abdomen is soft and nontender, obese, no palpable organomegaly Extremities are warm and well perfused, no cyanosis edema or deformity Neuro grossly intact Psych normal mood and affect Skin: She is actively draining hidradenitis in the right axilla, multiple areas of scar tissue in the left axilla as well    Assessment & Plan (Hardeep Reetz A. 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 I offered her excision of the right side first. I described the procedure to her- would include excision of all the hair-bearing skin of the right axilla and while she may have enough redundant skin to close  primarily, it may require a rhomboid flap from the skin on her back/side. Described risk of bleeding, infection, pain, scarring, flap necrosis, and slow recovery process. She understands these and remains interested in surgery.

## 2016-05-03 NOTE — Patient Instructions (Signed)
Christy HarderShannon T Huang  05/03/2016   Your procedure is scheduled on: 05/05/2016    Report to [facility]  [place]tr[place]vators to 3rd floor to  Short Stay Center at   0515 AM.  Call this number if you have problems the morning of surgery [phone]   Remember: ONLY 1 PERSON MAY GO WITH YOU TO SHORT STAY TO GET  READY MORNING OF YOUR SURGERY.  Do not eat food or drink liquids :After Midnight.     Take these medicines the morning of surgery with A SIP OF WATER: Carvedilol ( coreg), Prilosec                                 You may not have any metal on your body including hair pins and              piercings  Do not wear jewelry, make-up, lotions, powders or perfumes, deodorant             Do not wear nail polish.  Do not shave  48 hours prior to surgery.                 Do not bring valuables to the hospital. [facility] IS NOT             RESPONSIBLE   FOR VALUABLES.  Contacts, dentures or bridgework may not be worn into surgery.  Leave suitcase in the car. After surgery it may be brought to your room.       Special Instructions: N/A              Please read over the following fact sheets you were given: _____________________________________________________________________             [facility] - Preparing for [facility] Before surgery, you can play an important role.  Because skin is not sterile, your skin needs to be as free of germs as possible.  You can reduce the number of germs on your skin by washing with CHG (chlorahexidine gluconate) soap before surgery.  CHG is an antiseptic cleaner which kills germs and bonds with the skin to continue killing germs even after washing. Please DO NOT use if you have an allergy to CHG or antibacterial soaps.  If your skin becomes reddened/irritated stop using the CHG and inform your nurse when you arrive at Short Stay. Do not shave (including legs and underarms) for at least 48 hours prior to the first CHG  shower.  You may shave your face/neck. Please follow these instructions carefully:  1.  Shower with CHG Soap the night before surgery and the  morning of Surgery.  2.  If you choose to wash your hair, wash your hair first as usual with your  normal  shampoo.  3.  After you shampoo, rinse your hair and body thoroughly to remove the  shampoo.                           4.  Use CHG as you would any other liquid soap.  You can apply chg directly  to the skin and wash                       Gently with a scrungie or clean  washcloth.  5.  Apply the CHG Soap to your body ONLY FROM THE NECK DOWN.   Do not use on face/ open                           Wound or open sores. Avoid contact with eyes, ears mouth and genitals (private parts).                       Wash face,  Genitals (private parts) with your normal soap.             6.  Wash thoroughly, paying special attention to the area where your surgery  will be performed.  7.  Thoroughly rinse your body with warm water from the neck down.  8.  DO NOT shower/wash with your normal soap after using and rinsing off  the CHG Soap.                9.  Pat yourself dry with a clean towel.            10.  Wear clean pajamas.            11.  Place clean sheets on your bed the night of your first shower and do not  sleep with pets. Day of Surgery : Do not apply any lotions/deodorants the morning of surgery.  Please wear clean clothes to the hospital/surgery center.  FAILURE TO FOLLOW THESE INSTRUCTIONS MAY RESULT IN THE CANCELLATION OF YOUR SURGERY PATIENT SIGNATURE_________________________________  NURSE SIGNATURE__________________________________  ________________________________________________________________________

## 2016-05-04 ENCOUNTER — Encounter (HOSPITAL_COMMUNITY): Payer: Self-pay

## 2016-05-04 ENCOUNTER — Encounter (HOSPITAL_COMMUNITY)
Admission: RE | Admit: 2016-05-04 | Discharge: 2016-05-04 | Disposition: A | Discharge status: Home / Self Care | Payer: Medicare Other | Source: Ambulatory Visit | Attending: Surgery | Admitting: Surgery

## 2016-05-04 DIAGNOSIS — L732 Hidradenitis suppurativa: Secondary | ICD-10-CM | POA: Diagnosis not present

## 2016-05-04 DIAGNOSIS — Z6837 Body mass index (BMI) 37.0-37.9, adult: Secondary | ICD-10-CM | POA: Diagnosis not present

## 2016-05-04 DIAGNOSIS — Z79899 Other long term (current) drug therapy: Secondary | ICD-10-CM | POA: Diagnosis not present

## 2016-05-04 DIAGNOSIS — D649 Anemia, unspecified: Secondary | ICD-10-CM | POA: Diagnosis not present

## 2016-05-04 DIAGNOSIS — K219 Gastro-esophageal reflux disease without esophagitis: Secondary | ICD-10-CM | POA: Diagnosis not present

## 2016-05-04 DIAGNOSIS — I10 Essential (primary) hypertension: Secondary | ICD-10-CM | POA: Diagnosis not present

## 2016-05-04 DIAGNOSIS — E669 Obesity, unspecified: Secondary | ICD-10-CM | POA: Diagnosis not present

## 2016-05-04 HISTORY — DX: Cardiac murmur, unspecified: R01.1

## 2016-05-04 HISTORY — DX: Anemia, unspecified: D64.9

## 2016-05-04 HISTORY — DX: Gastro-esophageal reflux disease without esophagitis: K21.9

## 2016-05-04 LAB — CBC WITH DIFFERENTIAL/PLATELET
Basophils Absolute: 0 10*3/uL (ref 0.0–0.1)
Basophils Relative: 1 %
Eosinophils Absolute: 0.1 10*3/uL (ref 0.0–0.7)
Eosinophils Relative: 2 %
HEMATOCRIT: 33.7 % — AB (ref 36.0–46.0)
HEMOGLOBIN: 10.6 g/dL — AB (ref 12.0–15.0)
LYMPHS ABS: 1.8 10*3/uL (ref 0.7–4.0)
LYMPHS PCT: 41 %
MCH: 23.2 pg — AB (ref 26.0–34.0)
MCHC: 31.5 g/dL (ref 30.0–36.0)
MCV: 73.9 fL — AB (ref 78.0–100.0)
Monocytes Absolute: 0.3 10*3/uL (ref 0.1–1.0)
Monocytes Relative: 8 %
NEUTROS PCT: 48 %
Neutro Abs: 2.1 10*3/uL (ref 1.7–7.7)
Platelets: 460 10*3/uL — ABNORMAL HIGH (ref 150–400)
RBC: 4.56 MIL/uL (ref 3.87–5.11)
RDW: 16.5 % — ABNORMAL HIGH (ref 11.5–15.5)
WBC: 4.2 10*3/uL (ref 4.0–10.5)

## 2016-05-04 LAB — BASIC METABOLIC PANEL
Anion gap: 6 (ref 5–15)
BUN: 8 mg/dL (ref 6–20)
CHLORIDE: 101 mmol/L (ref 101–111)
CO2: 28 mmol/L (ref 22–32)
Calcium: 8.9 mg/dL (ref 8.9–10.3)
Creatinine, Ser: 0.8 mg/dL (ref 0.44–1.00)
GFR calc Af Amer: >60 mL/min (ref >60)
GFR calc non Af Amer: >60 mL/min (ref >60)
GLUCOSE: 101 mg/dL — AB (ref 65–99)
POTASSIUM: 3.5 mmol/L (ref 3.5–5.1)
SODIUM: 135 mmol/L (ref 135–145)

## 2016-05-04 LAB — HCG, SERUM, QUALITATIVE: PREG SERUM: NEGATIVE

## 2016-05-04 NOTE — Progress Notes (Signed)
CBC done 05/04/16 faxed via EPIC to Dr Fredricka Bonineonnor.

## 2016-05-05 ENCOUNTER — Ambulatory Visit (HOSPITAL_COMMUNITY): Payer: Medicare Other | Admitting: Anesthesiology

## 2016-05-05 ENCOUNTER — Encounter (HOSPITAL_COMMUNITY): Payer: Self-pay | Admitting: *Deleted

## 2016-05-05 ENCOUNTER — Encounter (HOSPITAL_COMMUNITY)
Admission: RE | Disposition: A | Discharge status: Home / Self Care | Payer: Self-pay | Source: Ambulatory Visit | Attending: Surgery

## 2016-05-05 ENCOUNTER — Observation Stay (HOSPITAL_COMMUNITY)
Admission: RE | Admit: 2016-05-05 | Discharge: 2016-05-06 | Disposition: A | Discharge status: Home / Self Care | Payer: Medicare Other | Source: Ambulatory Visit | Attending: Surgery | Admitting: Surgery

## 2016-05-05 DIAGNOSIS — I10 Essential (primary) hypertension: Secondary | ICD-10-CM | POA: Insufficient documentation

## 2016-05-05 DIAGNOSIS — E669 Obesity, unspecified: Secondary | ICD-10-CM | POA: Insufficient documentation

## 2016-05-05 DIAGNOSIS — K219 Gastro-esophageal reflux disease without esophagitis: Secondary | ICD-10-CM | POA: Insufficient documentation

## 2016-05-05 DIAGNOSIS — D649 Anemia, unspecified: Secondary | ICD-10-CM | POA: Diagnosis not present

## 2016-05-05 DIAGNOSIS — L732 Hidradenitis suppurativa: Principal | ICD-10-CM | POA: Insufficient documentation

## 2016-05-05 DIAGNOSIS — Z79899 Other long term (current) drug therapy: Secondary | ICD-10-CM | POA: Insufficient documentation

## 2016-05-05 DIAGNOSIS — Z6837 Body mass index (BMI) 37.0-37.9, adult: Secondary | ICD-10-CM | POA: Insufficient documentation

## 2016-05-05 DIAGNOSIS — N760 Acute vaginitis: Secondary | ICD-10-CM | POA: Diagnosis not present

## 2016-05-05 HISTORY — PX: HYDRADENITIS EXCISION: SHX5243

## 2016-05-05 SURGERY — EXCISION, HIDRADENITIS, AXILLA
Anesthesia: General | Site: Axilla | Laterality: Right

## 2016-05-05 MED ORDER — OXYCODONE HCL 5 MG PO TABS
5.0000 mg | ORAL_TABLET | ORAL | Status: DC | PRN
  Administered 2016-05-05 (×2): 5 mg via ORAL
  Administered 2016-05-06 (×2): 10 mg via ORAL
  Filled 2016-05-05: qty 1
  Filled 2016-05-05 (×2): qty 2
  Filled 2016-05-05: qty 1

## 2016-05-05 MED ORDER — ONDANSETRON HCL 4 MG/2ML IJ SOLN
INTRAMUSCULAR | Status: AC
  Filled 2016-05-05: qty 2

## 2016-05-05 MED ORDER — HYDROMORPHONE HCL 1 MG/ML IJ SOLN: 0.2500 mg | INTRAMUSCULAR | Status: DC | PRN

## 2016-05-05 MED ORDER — MIDAZOLAM HCL 2 MG/2ML IJ SOLN
INTRAMUSCULAR | Status: AC
  Filled 2016-05-05: qty 2

## 2016-05-05 MED ORDER — LIDOCAINE 2% (20 MG/ML) 5 ML SYRINGE
INTRAMUSCULAR | Status: AC
  Filled 2016-05-05: qty 5

## 2016-05-05 MED ORDER — ONDANSETRON 4 MG PO TBDP: 4.0000 mg | ORAL_TABLET | Freq: Four times a day (QID) | ORAL | Status: DC | PRN

## 2016-05-05 MED ORDER — DIPHENHYDRAMINE HCL 25 MG PO CAPS: 25.0000 mg | ORAL_CAPSULE | Freq: Four times a day (QID) | ORAL | Status: DC | PRN

## 2016-05-05 MED ORDER — MIDAZOLAM HCL 5 MG/5ML IJ SOLN
INTRAMUSCULAR | Status: DC | PRN
  Administered 2016-05-05: 2 mg via INTRAVENOUS

## 2016-05-05 MED ORDER — DOCUSATE SODIUM 100 MG PO CAPS: 100.0000 mg | ORAL_CAPSULE | Freq: Two times a day (BID) | ORAL | 0 refills | Status: AC

## 2016-05-05 MED ORDER — BUPIVACAINE HCL (PF) 0.25 % IJ SOLN
INTRAMUSCULAR | Status: AC
Start: 2016-05-05 — End: 2016-05-05
  Filled 2016-05-05: qty 30

## 2016-05-05 MED ORDER — 0.9 % SODIUM CHLORIDE (POUR BTL) OPTIME
TOPICAL | Status: DC | PRN
  Administered 2016-05-05: 1000 mL

## 2016-05-05 MED ORDER — IBUPROFEN 200 MG PO TABS
600.0000 mg | ORAL_TABLET | Freq: Four times a day (QID) | ORAL | Status: DC | PRN
  Administered 2016-05-05 – 2016-05-06 (×2): 600 mg via ORAL
  Filled 2016-05-05 (×2): qty 3

## 2016-05-05 MED ORDER — PROPOFOL 10 MG/ML IV BOLUS
INTRAVENOUS | Status: AC
  Filled 2016-05-05: qty 20

## 2016-05-05 MED ORDER — KETOROLAC TROMETHAMINE 30 MG/ML IJ SOLN
INTRAMUSCULAR | Status: AC
  Filled 2016-05-05: qty 1

## 2016-05-05 MED ORDER — LIDOCAINE HCL (CARDIAC) 20 MG/ML IV SOLN
INTRAVENOUS | Status: DC | PRN
  Administered 2016-05-05: 50 mg via INTRAVENOUS

## 2016-05-05 MED ORDER — PANTOPRAZOLE SODIUM 40 MG PO TBEC
40.0000 mg | DELAYED_RELEASE_TABLET | Freq: Every day | ORAL | Status: DC
  Administered 2016-05-05 – 2016-05-06 (×2): 40 mg via ORAL
  Filled 2016-05-05 (×2): qty 1

## 2016-05-05 MED ORDER — DOCUSATE SODIUM 100 MG PO CAPS
100.0000 mg | ORAL_CAPSULE | Freq: Two times a day (BID) | ORAL | Status: DC
  Administered 2016-05-05 – 2016-05-06 (×2): 100 mg via ORAL
  Filled 2016-05-05 (×2): qty 1

## 2016-05-05 MED ORDER — PROPOFOL 10 MG/ML IV BOLUS
INTRAVENOUS | Status: DC | PRN
  Administered 2016-05-05: 200 mg via INTRAVENOUS

## 2016-05-05 MED ORDER — CEFAZOLIN SODIUM-DEXTROSE 2-4 GM/100ML-% IV SOLN
2.0000 g | INTRAVENOUS | Status: AC
  Administered 2016-05-05: 2 g via INTRAVENOUS
  Filled 2016-05-05: qty 100

## 2016-05-05 MED ORDER — METHOCARBAMOL 500 MG PO TABS
500.0000 mg | ORAL_TABLET | Freq: Four times a day (QID) | ORAL | Status: DC | PRN
  Administered 2016-05-05: 500 mg via ORAL
  Filled 2016-05-05: qty 1

## 2016-05-05 MED ORDER — ACETAMINOPHEN 650 MG RE SUPP: 650.0000 mg | Freq: Four times a day (QID) | RECTAL | Status: DC | PRN

## 2016-05-05 MED ORDER — HEPARIN SODIUM (PORCINE) 5000 UNIT/ML IJ SOLN
5000.0000 [IU] | Freq: Once | INTRAMUSCULAR | Status: AC
  Administered 2016-05-05: 5000 [IU] via SUBCUTANEOUS
  Filled 2016-05-05: qty 1

## 2016-05-05 MED ORDER — BUPIVACAINE HCL (PF) 0.25 % IJ SOLN
INTRAMUSCULAR | Status: DC | PRN
  Administered 2016-05-05: 30 mL

## 2016-05-05 MED ORDER — LACTATED RINGERS IV SOLN
INTRAVENOUS | Status: DC
  Administered 2016-05-05: 09:00:00 via INTRAVENOUS

## 2016-05-05 MED ORDER — FENTANYL CITRATE (PF) 250 MCG/5ML IJ SOLN
INTRAMUSCULAR | Status: AC
  Filled 2016-05-05: qty 5

## 2016-05-05 MED ORDER — KETOROLAC TROMETHAMINE 30 MG/ML IJ SOLN
INTRAMUSCULAR | Status: DC | PRN
  Administered 2016-05-05: 30 mg via INTRAVENOUS

## 2016-05-05 MED ORDER — LACTATED RINGERS IV SOLN
INTRAVENOUS | Status: DC | PRN
  Administered 2016-05-05: 07:00:00 via INTRAVENOUS

## 2016-05-05 MED ORDER — ACETAMINOPHEN 325 MG PO TABS: 650.0000 mg | ORAL_TABLET | Freq: Four times a day (QID) | ORAL | Status: DC | PRN

## 2016-05-05 MED ORDER — CARVEDILOL 12.5 MG PO TABS
12.5000 mg | ORAL_TABLET | Freq: Two times a day (BID) | ORAL | Status: DC
  Administered 2016-05-05 – 2016-05-06 (×2): 12.5 mg via ORAL
  Filled 2016-05-05 (×3): qty 1

## 2016-05-05 MED ORDER — HYDROMORPHONE HCL 1 MG/ML IJ SOLN
0.2000 mg | INTRAMUSCULAR | Status: DC | PRN
  Administered 2016-05-05: 0.2 mg via INTRAVENOUS
  Filled 2016-05-05: qty 1

## 2016-05-05 MED ORDER — CEFAZOLIN SODIUM-DEXTROSE 2-4 GM/100ML-% IV SOLN
INTRAVENOUS | Status: AC
  Filled 2016-05-05: qty 100

## 2016-05-05 MED ORDER — FENTANYL CITRATE (PF) 250 MCG/5ML IJ SOLN
INTRAMUSCULAR | Status: DC | PRN
  Administered 2016-05-05: 150 ug via INTRAVENOUS
  Administered 2016-05-05: 50 ug via INTRAVENOUS

## 2016-05-05 MED ORDER — ENOXAPARIN SODIUM 40 MG/0.4ML ~~LOC~~ SOLN
40.0000 mg | SUBCUTANEOUS | Status: DC
  Administered 2016-05-05: 40 mg via SUBCUTANEOUS
  Filled 2016-05-05: qty 0.4

## 2016-05-05 MED ORDER — ONDANSETRON HCL 4 MG/2ML IJ SOLN: 4.0000 mg | Freq: Four times a day (QID) | INTRAMUSCULAR | Status: DC | PRN

## 2016-05-05 MED ORDER — DIPHENHYDRAMINE HCL 50 MG/ML IJ SOLN: 25.0000 mg | Freq: Four times a day (QID) | INTRAMUSCULAR | Status: DC | PRN

## 2016-05-05 MED ORDER — OXYCODONE-ACETAMINOPHEN 5-325 MG PO TABS: 1.0000 | ORAL_TABLET | Freq: Four times a day (QID) | ORAL | 0 refills | Status: DC | PRN

## 2016-05-05 MED ORDER — ONDANSETRON HCL 4 MG/2ML IJ SOLN
INTRAMUSCULAR | Status: DC | PRN
  Administered 2016-05-05: 4 mg via INTRAVENOUS

## 2016-05-05 SURGICAL SUPPLY — 23 items
COVER SURGICAL LIGHT HANDLE (MISCELLANEOUS) ×3 IMPLANT
DRAPE LAPAROSCOPIC ABDOMINAL (DRAPES) ×3 IMPLANT
DRAPE SHEET LG 3/4 BI-LAMINATE (DRAPES) IMPLANT
DRSG ADAPTIC 3X8 NADH LF (GAUZE/BANDAGES/DRESSINGS) ×3 IMPLANT
DRSG PAD ABDOMINAL 8X10 ST (GAUZE/BANDAGES/DRESSINGS) ×3 IMPLANT
ELECT REM PT RETURN 9FT ADLT (ELECTROSURGICAL) ×3
ELECTRODE REM PT RTRN 9FT ADLT (ELECTROSURGICAL) ×1 IMPLANT
GAUZE SPONGE 4X4 12PLY STRL (GAUZE/BANDAGES/DRESSINGS) ×3 IMPLANT
GLOVE ECLIPSE 8.0 STRL XLNG CF (GLOVE) ×3 IMPLANT
GLOVE INDICATOR 6.5 STRL GRN (GLOVE) ×3 IMPLANT
GLOVE INDICATOR 8.0 STRL GRN (GLOVE) ×3 IMPLANT
GLOVE SURG SS PI 6.5 STRL IVOR (GLOVE) ×3 IMPLANT
GOWN STRL REUS W/TWL LRG LVL3 (GOWN DISPOSABLE) ×6 IMPLANT
KIT BASIN OR (CUSTOM PROCEDURE TRAY) ×3 IMPLANT
PACK GENERAL/GYN (CUSTOM PROCEDURE TRAY) ×3 IMPLANT
SUT ETHILON 2 0 PS N (SUTURE) ×9 IMPLANT
SUT MNCRL AB 4-0 PS2 18 (SUTURE) ×3 IMPLANT
SUT VIC AB 3-0 SH 18 (SUTURE) ×3 IMPLANT
SUT VIC AB 3-0 SH 27 (SUTURE) ×3
SUT VIC AB 3-0 SH 27X BRD (SUTURE) ×1 IMPLANT
TAPE CLOTH SURG 4X10 WHT LF (GAUZE/BANDAGES/DRESSINGS) ×3 IMPLANT
TOWEL OR 17X26 10 PK STRL BLUE (TOWEL DISPOSABLE) ×3 IMPLANT
TOWEL OR NON WOVEN STRL DISP B (DISPOSABLE) IMPLANT

## 2016-05-05 NOTE — Op Note (Signed)
Operative Note  Carney HarderShannon T Dower 35 y.o. female [id number]  05/05/2016  Surgeon: Berna Buehelsea A Dezyrae Kensinger   Assistant: none  Procedure performed: excision of right axillary hidradenitis   Preop diagnosis: Hidradenitis Axillaris, right  Post-op diagnosis/intraop findings: same  Specimens: Right axillary skin  EBL: 10cc  Complications: none  Description of procedure: After obtaining informed consent the patient was brought to the operating room. Prophylactic antibiotics and subcutaneous heparin were administered. SCD's were applied. General anesthesia with LMA was initiated. The right axilla was prepped and draped in the usual sterile fashion. The hair-bearing skin of the axilla which contained the multiple/confluent sinus tracks and inflammatory induration was outlined within an ellipse which measured 15x7cm, approximately. The skin was incised sharply and the remainder of the dissection was carried out with electrocautery to remove the skin with a small amount of subcutaneous fat within the marked area. This was sent for routine pathological examination. Hemostasis within the wound was ensured with cautery. The wound was then closed primarily with several interrupted deep dermal 3-0 vicryls followed by interrupted vertical mattress sutures of 2-0 nylon. Local anesthetic was then injected around the incision. A dressing of vaseline gauze, fluffs and ABD was applied.  The patient was awakened, extubated and transported to the recovery room in stable condition.   All counts were correct at the completion of the case.

## 2016-05-05 NOTE — H&P (Signed)
Christy HarderShannon T. 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 She has been dealing with it for many years and has tried numerous remedies-she no longer shaves that region, she does not use any chemical the deodorants, she uses doxycycline PO and clindamycin ointment, and she has also tried her remedies such as drinking water with vinegar, and garlic. She is extremely frustrated by this and is eager to have a more definitive solution. Currently only the right side is inflamed/draining.  She works as a LawyerCNA. She lives at home with her children. She does not use tobacco products or alcohol but occasionally uses marijuana. She goes to the gym nearly every day as she is also trying to lose weight.   Other Problems  Gastroesophageal Reflux Disease High blood pressure  Past Surgical History  No pertinent past surgical history  Diagnostic Studies History  Colonoscopy never Mammogram never Pap Smear 1-5 years ago  Allergies Sulfa 10 *OPHTHALMIC AGENTS*  Medication History  Biotin (10000MCG Tablet, Oral) Active. Carvedilol (12.5MG  Tablet, Oral) Active. Clindamycin Phosphate (1% Solution, External) Active. Doxycycline Hyclate (100MG  Tablet, Oral) Active. Ginkoba (40MG  Tablet, Oral) Active. Omega 3 (1000MG  Capsule, Oral) Active. Omeprazole (20MG  Capsule DR, Oral) Active. Terconazole (0.4% Cream, Vaginal) Active. Triamterene-HCTZ (37.5-25MG  Capsule, Oral) Active. Medications Reconciled  Social History  Caffeine use Coffee. No alcohol use No drug use Tobacco use Never smoker.  Family History  Diabetes Mellitus Mother. Heart Disease  Mother. Hypertension Mother. Kidney Disease Mother.  Pregnancy / Birth History  Age at menarche 11 years. Gravida 6 Length (months) of breastfeeding 7-12 Maternal age 35-20 Para 3 Regular periods     Review of Systems General Not Present- Appetite Loss, Chills, Fatigue, Fever, Night Sweats, Weight Gain and Weight Loss. Skin Not Present- Change in Wart/Mole, Dryness, Hives, Jaundice, New Lesions, Non-Healing Wounds, Rash and Ulcer. HEENT Not Present- Earache, Hearing Loss, Hoarseness, Nose Bleed, Oral Ulcers, Ringing in the Ears, Seasonal Allergies, Sinus Pain, Sore Throat, Visual Disturbances, Wears glasses/contact lenses and Yellow Eyes. Respiratory Not Present- Bloody sputum, Chronic Cough, Difficulty Breathing, Snoring and Wheezing. Breast Not Present- Breast Mass, Breast Pain, Nipple Discharge and Skin Changes. Cardiovascular Not Present- Chest Pain, Difficulty Breathing Lying Down, Leg Cramps, Palpitations, Rapid Heart Rate, Shortness of Breath and Swelling of Extremities. Gastrointestinal Not Present- Abdominal Pain, Bloating, Bloody Stool, Change in Bowel Habits, Chronic diarrhea, Constipation, Difficulty Swallowing, Excessive gas, Gets full quickly at meals, Hemorrhoids, Indigestion, Nausea, Rectal Pain and Vomiting. Female Genitourinary Not Present- Frequency, Nocturia, Painful Urination, Pelvic Pain and Urgency. Musculoskeletal Not Present- Back Pain, Joint Pain, Joint Stiffness, Muscle Pain, Muscle Weakness and Swelling of Extremities. Neurological Not Present- Decreased Memory, Fainting, Headaches, Numbness, Seizures, Tingling, Tremor, Trouble walking and Weakness. Psychiatric Not Present- Anxiety, Bipolar, Change in Sleep Pattern, Depression, Fearful and Frequent crying. Endocrine Not Present- Cold Intolerance, Excessive Hunger, Hair Changes, Heat Intolerance, Hot flashes and New Diabetes. Hematology Not Present- Blood Thinners, Easy Bruising, Excessive bleeding,  Gland problems, HIV and Persistent Infections.  Vitals  03/30/2016 11:24 AM Weight: 210 lb Height: 62in Body Surface Area: 1.95 m Body Mass Index: 38.41 kg/m  Temp.: 98.26F  Pulse: 84 (Regular)  BP: 130/70 (Sitting, Left Arm, Standard)  Physical Exam  The physical  exam findings are as follows: Note:She is alert, oriented, no distress, obese female Extraocular motions intact, anicteric, pupils equally round and reactive, moist mucous membranes Neck without mass or thyromegaly Unlabored respirations, clear bilaterally, regular rate and rhythm with probable distal pulses Abdomen is soft and nontender, obese, no palpable organomegaly Extremities are warm and well perfused, no cyanosis edema or deformity Neuro grossly intact Psych normal mood and affect Skin: She is actively draining hidradenitis in the right axilla, multiple areas of scar tissue in the left axilla as well   Assessment & Plan   HIDRADENITIS AXILLARIS (Principal Diagnosis) (L73.2) Story: Bilateral; she has already done her research and has gone to great efforts to control the process and is frustrated/ready to pursue surgery. I offered her excision of the right side first. I described the procedure to her- would include excision of all the hair-bearing skin of the right axilla and while she may have enough redundant skin to close primarily, it may require a local advancement flap. Described risk of bleeding, infection, pain, scarring, flap necrosis, and slow recovery process. She understands these and remains interested in surgery.

## 2016-05-05 NOTE — Anesthesia Preprocedure Evaluation (Addendum)
Anesthesia Evaluation  Patient identified by MRN, date of birth, ID band Patient awake    Reviewed: Allergy & Precautions, NPO status , Patient's Chart, lab work & pertinent test results  Airway Mallampati: II  TM Distance: >3 FB     Dental   Pulmonary neg pulmonary ROS,    breath sounds clear to auscultation       Cardiovascular hypertension,  Rhythm:Regular Rate:Normal     Neuro/Psych    GI/Hepatic Neg liver ROS, GERD  ,  Endo/Other  negative endocrine ROS  Renal/GU negative Renal ROS     Musculoskeletal   Abdominal   Peds  Hematology  (+) anemia ,   Anesthesia Other Findings   Reproductive/Obstetrics                            Anesthesia Physical Anesthesia Plan  ASA: III  Anesthesia Plan: General   Post-op Pain Management:    Induction:   Airway Management Planned: LMA  Additional Equipment:   Intra-op Plan:   Post-operative Plan: Extubation in OR  Informed Consent: I have reviewed the patients History and Physical, chart, labs and discussed the procedure including the risks, benefits and alternatives for the proposed anesthesia with the patient or authorized representative who has indicated his/her understanding and acceptance.   Dental advisory given  Plan Discussed with: CRNA and Anesthesiologist  Anesthesia Plan Comments:         Anesthesia Quick Evaluation

## 2016-05-05 NOTE — Discharge Instructions (Signed)
[facility] [phone]   POST OP INSTRUCTIONS  Always review your discharge instruction sheet given to you by the facility where your surgery was performed. IF YOU HAVE DISABILITY OR FAMILY LEAVE FORMS, YOU MUST BRING THEM TO THE OFFICE FOR PROCESSING.   DO NOT GIVE THEM TO YOUR DOCTOR. A prescription for pain medication may be given to you upon discharge.  Take your pain medication as prescribed, if needed.  If narcotic pain medicine is not needed, then you may take acetaminophen (Tylenol) or ibuprofen (Advil) as needed. 1. Take your usually prescribed medications unless otherwise directed. 2. If you need a refill on your pain medication, please contact your pharmacy.  They will contact our office to request authorization.  Prescriptions will not be filled after 5pm or on week-ends. 3. You should follow a light diet the first few days after arrival home, such as soup and crackers, etc.  Resume your normal diet the day after surgery. 4. Most patients will experience some swelling and bruising on the underarm.  Ice packs will help.  Swelling and bruising can take several days to resolve.  5. It is common to experience some constipation if taking pain medication after surgery.  Increasing fluid intake and taking a stool softener (such as Colace) will usually help or prevent this problem from occurring.  A mild laxative (Milk of Magnesia or Miralax) should be taken according to package instructions if there are no bowel movements after 48 hours. 6. Unless discharge instructions indicate otherwise, leave your bandage dry and in place until the second day after surgery.  You may take a shower after this- just let the soap and water run over the incision; no scrubbing or rubbing, and then gently pat dry. Do Not apply any lotions or ointments to the incision.  No tub baths or swimming until after sutures are removed.  Any sutures or staples will be removed at the office during your  follow-up visit. 7. ACTIVITIES:  You may resume regular (light) daily activities beginning the next day--such as daily self-care, walking, climbing stairs--gradually increasing activities as tolerated.  You may have sexual intercourse when it is comfortable. Limit right arm to gentle range of motion exercises.  Refrain from any heavy lifting or straining until approved by your doctor. a. You may drive when you are no longer taking prescription pain medication, you can comfortably wear a seatbelt, and you can safely maneuver your car and apply brakes. b. RETURN TO WORK: Pending follow up appointment __________________________________________________________ 8. You should see your doctor in the office for a follow-up appointment approximately 2 weeks afetr surgery 9. OTHER INSTRUCTIONS: ______________________________________________________________________________________________ ____________________________________________________________________________________________ WHEN TO CALL YOUR DOCTOR: 1. Fever over 101.0 2. Nausea and/or vomiting 3. Extreme swelling or bruising 4. Continued bleeding from incision. 5. Increased pain, redness, or drainage from the incision. The clinic staff is available to answer your questions during regular business hours.  Please dont hesitate to call and ask to speak to one of the nurses for clinical concerns.  If you have a medical emergency, go to the nearest emergency room or call 911.  A surgeon from [facility] is always on call at the hospital. [address], [address], [place], [place]  [id number] ? [address] [address], [place], [place]   [id number] [phone] ? [phone] ? FAX [phone] Web site: [email or web address]

## 2016-05-05 NOTE — Anesthesia Procedure Notes (Signed)
Procedure Name: LMA Insertion Date/Time: 05/05/2016 7:28 AM Performed by: Izora GalaHUGHES,  A Pre-anesthesia Checklist: Patient identified, Emergency Drugs available, Suction available, Patient being monitored and Timeout performed Patient Re-evaluated:Patient Re-evaluated prior to inductionOxygen Delivery Method: Circle system utilized Preoxygenation: Pre-oxygenation with 100% oxygen Intubation Type: IV induction Ventilation: Mask ventilation without difficulty LMA: LMA inserted LMA Size: 3.0 Number of attempts: 1 Placement Confirmation: positive ETCO2 and breath sounds checked- equal and bilateral Tube secured with: Tape Dental Injury: Teeth and Oropharynx as per pre-operative assessment

## 2016-05-05 NOTE — Transfer of Care (Signed)
Immediate Anesthesia Transfer of Care Note  Patient: Christy Huang  Procedure(s) Performed: Procedure(s): EXCISION OF RIGHT AXILLARY HIDRADENITIS (Right)  Patient Location: PACU  Anesthesia Type:General  Level of Consciousness: awake, alert  and oriented  Airway & Oxygen Therapy: Patient Spontanous Breathing and Patient connected to face mask oxygen  Post-op Assessment: Report given to RN and Post -op Vital signs reviewed and stable  Post vital signs: Reviewed and stable  Last Vitals:  Vitals:   05/05/16 0543  BP: 123/80  Pulse: 66  Resp: 16  Temp: 36.5 C    Last Pain:  Vitals:   05/05/16 0543  TempSrc: Oral  PainSc:       Patients Stated Pain Goal: 4 (05/05/16 0539)  Complications: No apparent anesthesia complications

## 2016-05-05 NOTE — Anesthesia Postprocedure Evaluation (Signed)
Anesthesia Post Note  Patient: Christy Huang  Procedure(s) Performed: Procedure(s) (LRB): EXCISION OF RIGHT AXILLARY HIDRADENITIS (Right)  Patient location during evaluation: PACU Anesthesia Type: General Level of consciousness: awake Pain management: pain level controlled Vital Signs Assessment: post-procedure vital signs reviewed and stable Respiratory status: spontaneous breathing Cardiovascular status: stable Anesthetic complications: no    Last Vitals:  Vitals:   05/05/16 1130 05/05/16 1230  BP: 138/66 133/64  Pulse: 60 60  Resp: 16 16  Temp: 36.9 C 36.9 C    Last Pain:  Vitals:   05/05/16 1303  TempSrc:   PainSc: 5                  EDWARDS,Magalie Almon

## 2016-05-06 DIAGNOSIS — E669 Obesity, unspecified: Secondary | ICD-10-CM | POA: Diagnosis not present

## 2016-05-06 DIAGNOSIS — L732 Hidradenitis suppurativa: Secondary | ICD-10-CM | POA: Diagnosis not present

## 2016-05-06 DIAGNOSIS — Z79899 Other long term (current) drug therapy: Secondary | ICD-10-CM | POA: Diagnosis not present

## 2016-05-06 DIAGNOSIS — Z6837 Body mass index (BMI) 37.0-37.9, adult: Secondary | ICD-10-CM | POA: Diagnosis not present

## 2016-05-06 DIAGNOSIS — K219 Gastro-esophageal reflux disease without esophagitis: Secondary | ICD-10-CM | POA: Diagnosis not present

## 2016-05-06 DIAGNOSIS — I10 Essential (primary) hypertension: Secondary | ICD-10-CM | POA: Diagnosis not present

## 2016-05-06 MED ORDER — OXYCODONE HCL 5 MG PO TABS: 5.0000 mg | ORAL_TABLET | ORAL | 0 refills | Status: DC | PRN

## 2016-05-06 NOTE — Care Management Obs Status (Signed)
MEDICARE OBSERVATION STATUS NOTIFICATION   Patient Details  Name: Christy HarderShannon T Schloss MRN: [id number] Date of Birth: 15-Sep-1980   Medicare Observation Status Notification Given:  Yes    Elliot CousinShavis, Mayco Walrond Ellen, RN 05/06/2016, 11:43 AM

## 2016-05-06 NOTE — Progress Notes (Signed)
Discharge instructions gone over with pt with all questions answered. Pt experiencing no pain at time of discharge.

## 2016-05-06 NOTE — Care Management Note (Signed)
Case Management Note  Patient Details  Name: Christy Huang MRN: [id number] Date of Birth: 1980-09-08  Subjective/Objective:   excision of right axillary hidradenitis                  Action/Plan: Discharge Planning: AVS reviewed: NCM spoke to pt and no NCM needs identified. Has teenage son that can assist at home and other family support.   PCP - Coral Ceoharles Polansky MD  Expected Discharge Date:  05/06/2016              Expected Discharge Plan:  Home/Self Care  In-House Referral:  NA  Discharge planning Services  CM Consult  Post Acute Care Choice:  NA Choice offered to:  NA  DME Arranged:  N/A DME Agency:  NA  HH Arranged:  NA HH Agency:  NA  Status of Service:  Completed, signed off  If discussed at Long Length of Stay Meetings, dates discussed:    Additional Comments:  Elliot CousinShavis, Vida Nicol Ellen, RN 05/06/2016, 11:48 AM

## 2016-05-06 NOTE — Progress Notes (Signed)
1 Day Post-Op  Subjective: Only complaint is mild pain at the surgical site  Objective: Vital signs in last 24 hours: Temp:  [97.6 F (36.4 C)-98.8 F (37.1 C)] 98.8 F (37.1 C) (11/11 0638) Pulse Rate:  [48-60] 57 (11/11 0638) Resp:  [13-19] 14 (11/11 0638) BP: (95-153)/(55-87) 96/56 (11/11 0638) SpO2:  [98 %-100 %] 100 % (11/11 [id number]) Last BM Date: 05/04/16  Intake/Output from previous day: 11/10 0701 - 11/11 0700 In: 900 [I.V.:900] Out: 410 [Urine:400; Blood:10] Intake/Output this shift: No intake/output data recorded.  General appearance: alert, cooperative and no distress Incision/Wound: Dressing changed. Minimal bloody drainage.  Lab Results:   Recent Labs  05/04/16 0920  WBC 4.2  HGB 10.6*  HCT 33.7*  PLT 460*   BMET  Recent Labs  05/04/16 0920  NA 135  K 3.5  CL 101  CO2 28  GLUCOSE 101*  BUN 8  CREATININE 0.80  CALCIUM 8.9     Studies/Results: No results found.  Anti-infectives: Anti-infectives    Start     Dose/Rate Route Frequency Ordered Stop   05/05/16 0600  ceFAZolin (ANCEF) IVPB 2g/100 mL premix     2 g 200 mL/hr over 30 Minutes Intravenous On call to O.R. 05/05/16 0516 05/05/16 0729      Assessment/Plan: s/p Procedure(s): EXCISION OF RIGHT AXILLARY HIDRADENITIS Doing well. Okay for discharge.   LOS: 0 days    Christy Huang 11/11/2017Patient ID: Christy HarderShannon Huang Huang, female   DOB: 08/24/1980, 35 y.o.   MRN: [id number]

## 2016-05-08 ENCOUNTER — Encounter (HOSPITAL_COMMUNITY): Payer: Self-pay | Admitting: Surgery

## 2016-05-15 NOTE — Discharge Summary (Signed)
Physician Discharge Summary  Patient ID: Christy Huang MRN: [id number] DOB/AGE: 07/25/1980 35 y.o.  Admit date: 05/05/2016 Discharge date: 05/15/2016  Admission Diagnoses: hidradenitis  Discharge Diagnoses:  Active Problems:   Hidradenitis axillaris   Discharged Condition: good  Hospital Course: Admitted for obs s/p excision of right axillary hidradenitis. Did well overnight, pain was well controlled.   Consults: None  Significant Diagnostic Studies: n/a  Treatments: surgery: excision of right axillary hidradenitis with primary closure  Discharge Exam: Blood pressure 126/68, pulse 62, temperature 98.7 F (37.1 C), temperature source Oral, resp. rate 17, height 5\' 2"  (1.575 m), weight 93.4 kg (206 lb), last menstrual period 04/27/2016, SpO2 100 %. Please refer to rounding partner's progress note.   Disposition: 01-Home or Self Care  Discharge Instructions    Discharge patient    Complete by:  As directed        Medication List    TAKE these medications   Biotin 4098110000 MCG Tabs Take 1 tablet by mouth daily.   carvedilol 12.5 MG tablet Commonly known as:  COREG Take 1 tablet (12.5 mg total) by mouth 2 (two) times daily with a meal.   clindamycin 1 % external solution Commonly known as:  CLEOCIN T APPLY TOPICALLY 2 (TWO) TIMES DAILY. What changed:  how much to take  how to take this  when to take this  reasons to take this  additional instructions   docusate sodium 100 MG capsule Commonly known as:  COLACE Take 1 capsule (100 mg total) by mouth 2 (two) times daily.   doxycycline 100 MG tablet Commonly known as:  VIBRA-TABS TAKE 1 TABLET BY MOUTH TWICE A DAY What changed:  See the new instructions.   Fish Oil 1000 MG Caps Take 1 capsule by mouth daily.   Ginkgo Biloba 40 MG Tabs Take 1 tablet by mouth daily.   omeprazole 20 MG capsule Commonly known as:  PRILOSEC Take 20 mg by mouth daily as needed.   oxyCODONE 5 MG immediate release  tablet Commonly known as:  Oxy IR/ROXICODONE Take 1-2 tablets (5-10 mg total) by mouth every 4 (four) hours as needed for moderate pain.   oxyCODONE-acetaminophen 5-325 MG tablet Commonly known as:  PERCOCET/ROXICET Take 1 tablet by mouth every 6 (six) hours as needed for severe pain.   terconazole 0.4 % vaginal cream Commonly known as:  TERAZOL 7 Place 1 applicator vaginally at bedtime. What changed:  when to take this  reasons to take this   triamterene-hydrochlorothiazide 37.5-25 MG capsule Commonly known as:  DYAZIDE TAKE 1 EACH (1 CAPSULE TOTAL) BY MOUTH EVERY MORNING.      Follow-up Information    Berna Buehelsea A Connor, MD Follow up in 2 week(s).   Specialty:  General Surgery Contact information: [phone]           Signed: Berna BueChelsea A Connor 05/15/2016, 10:44 AM

## 2016-05-27 ENCOUNTER — Other Ambulatory Visit: Payer: Self-pay | Admitting: Obstetrics

## 2016-05-30 ENCOUNTER — Telehealth: Payer: Self-pay

## 2016-05-30 NOTE — Telephone Encounter (Signed)
Returned pt call and advised that rx was sent to pharmacy.

## 2016-08-23 ENCOUNTER — Ambulatory Visit: Payer: Self-pay | Admitting: Surgery

## 2016-08-23 DIAGNOSIS — L732 Hidradenitis suppurativa: Secondary | ICD-10-CM | POA: Diagnosis not present

## 2016-08-23 NOTE — H&P (Signed)
  Christy Huang 08/23/2016 4:08 PM Location: [facility] Patient #: [id number] DOB: [birth year] Single / Language: Christy PondsEnglish / Race: Black or African American Female  History of Present Illness (Christy Crego A. Fredricka Bonineonnor Huang; 08/23/2016 4:25 PM) Patient words: Ms. Christy Huang returns for follow-up status post excision of right axillary hidradenitis in November as well as to discuss excision of the left side. The right side wound had dehisced little bit but has now completely healed and scarred in. She does have some stiffness of that skin and minimal pain. She is also expressed interest in having a sleeve gastrectomy and is scheduled for an information session soon. She's have not stress lately has her mother is going on dialysis.  The patient is a 36 year old female.   Allergies Christy Huang(Christy Huang, CMA; 08/23/2016 4:11 PM) Sulfa 10 *OPHTHALMIC AGENTS*  Medication History Christy Huang(Christy Huang, CMA; 08/23/2016 4:11 PM) Biotin (10000MCG Tablet, Oral) Active. Carvedilol (12.5MG  Tablet, Oral) Active. Clindamycin Phosphate (1% Solution, External) Active. Doxycycline Hyclate (100MG  Tablet, Oral) Active. Omega 3 (1000MG  Capsule, Oral) Active. Omeprazole (20MG  Capsule DR, Oral) Active. Terconazole (0.4% Cream, Vaginal) Active. Triamterene-HCTZ (37.5-25MG  Capsule, Oral) Active. Medications Reconciled     Review of Systems (Christy Huang; 08/23/2016 4:25 PM) All other systems negative  Vitals (Christy Huang CMA; 08/23/2016 4:10 PM) 08/23/2016 4:08 PM Weight: 204.38 lb Height: 62in Body Surface Area: 1.93 m Body Mass Index: 37.38 kg/m  Temp.: 98.47F  Pulse: 77 (Regular)  BP: 130/92 (Sitting, Left Arm, Standard)      Physical Exam (Christy Warehime A. Fredricka Bonineonnor Huang; 08/23/2016 4:26 PM)  The physical exam findings are as follows: Note:She is alert and oriented, no distress Unlabored respirations, symmetrical air entry Regular rate and rhythm, no pedal edema Abdomen soft nontender  nondistended extremities warm without deformity In the right axilla there is stiff scar tissue but no residual wound, no active inflammation. In the left axilla there is sequelae of chronic hidradenitis, no active infection at this time    Assessment & Plan (Christy Feutz A. 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 The wound had opened up a bit and has now healed in the form of a stiff scar. We talked about range of motion exercises and scar massage to help loosen that up a little bit. She desires excision on the left side. We discussed once again the nature of that surgery and the risks involved as well as the risk of having an open wound to begin with given her competitions on the right side. She understands and is eager to proceed. She's going to a bariatric information seminar soon, but I believe she is a Medicaid patient.

## 2016-10-03 NOTE — Patient Instructions (Signed)
DORITA ROWLANDS  10/03/2016   Your procedure is scheduled on: 10/06/2016    Report to [facility] Main  Entrance Take [place]  elevators to 3rd floor to  Short Stay Center at    0730 AM.    Call this number if you have problems the morning of surgery [phone]    Remember: ONLY 1 PERSON MAY GO WITH YOU TO SHORT STAY TO GET  READY MORNING OF YOUR SURGERY.  Do not eat food or drink liquids :After Midnight.     Take these medicines the morning of surgery with A SIP OF WATER: Omeprazole if needed                                 You may not have any metal on your body including hair pins and              piercings  Do not wear jewelry, make-up, lotions, powders or perfumes, deodorant             Do not wear nail polish.  Do not shave  48 hours prior to surgery.                 Do not bring valuables to the hospital.  IS NOT             RESPONSIBLE   FOR VALUABLES.  Contacts, dentures or bridgework may not be worn into surgery.       Patients discharged the day of surgery will not be allowed to drive home.  Name and phone number of your driver:                Please read over the following fact sheets you were given: _____________________________________________________________________             [facility] - Preparing for Surgery Before surgery, you can play an important role.  Because skin is not sterile, your skin needs to be as free of germs as possible.  You can reduce the number of germs on your skin by washing with CHG (chlorahexidine gluconate) soap before surgery.  CHG is an antiseptic cleaner which kills germs and bonds with the skin to continue killing germs even after washing. Please DO NOT use if you have an allergy to CHG or antibacterial soaps.  If your skin becomes reddened/irritated stop using the CHG and inform your nurse when you arrive at Short Stay. Do not shave (including legs and underarms) for at least 48 hours prior to  the first CHG shower.  You may shave your face/neck. Please follow these instructions carefully:  1.  Shower with CHG Soap the night before surgery and the  morning of Surgery.  2.  If you choose to wash your hair, wash your hair first as usual with your  normal  shampoo.  3.  After you shampoo, rinse your hair and body thoroughly to remove the  shampoo.                           4.  Use CHG as you would any other liquid soap.  You can apply chg directly  to the skin and wash  Gently with a scrungie or clean washcloth.  5.  Apply the CHG Soap to your body ONLY FROM THE NECK DOWN.   Do not use on face/ open                           Wound or open sores. Avoid contact with eyes, ears mouth and genitals (private parts).                       Wash face,  Genitals (private parts) with your normal soap.             6.  Wash thoroughly, paying special attention to the area where your surgery  will be performed.  7.  Thoroughly rinse your body with warm water from the neck down.  8.  DO NOT shower/wash with your normal soap after using and rinsing off  the CHG Soap.                9.  Pat yourself dry with a clean towel.            10.  Wear clean pajamas.            11.  Place clean sheets on your bed the night of your first shower and do not  sleep with pets. Day of Surgery : Do not apply any lotions/deodorants the morning of surgery.  Please wear clean clothes to the hospital/surgery center.  FAILURE TO FOLLOW THESE INSTRUCTIONS MAY RESULT IN THE CANCELLATION OF YOUR SURGERY PATIENT SIGNATURE_________________________________  NURSE SIGNATURE__________________________________  ________________________________________________________________________

## 2016-10-04 ENCOUNTER — Encounter (HOSPITAL_COMMUNITY): Payer: Self-pay

## 2016-10-04 ENCOUNTER — Encounter (HOSPITAL_COMMUNITY)
Admission: RE | Admit: 2016-10-04 | Discharge: 2016-10-04 | Disposition: A | Discharge status: Home / Self Care | Payer: Medicare Other | Source: Ambulatory Visit | Attending: Surgery | Admitting: Surgery

## 2016-10-04 DIAGNOSIS — K219 Gastro-esophageal reflux disease without esophagitis: Secondary | ICD-10-CM | POA: Diagnosis not present

## 2016-10-04 DIAGNOSIS — I10 Essential (primary) hypertension: Secondary | ICD-10-CM | POA: Diagnosis not present

## 2016-10-04 DIAGNOSIS — Z01812 Encounter for preprocedural laboratory examination: Secondary | ICD-10-CM | POA: Insufficient documentation

## 2016-10-04 DIAGNOSIS — L732 Hidradenitis suppurativa: Secondary | ICD-10-CM | POA: Insufficient documentation

## 2016-10-04 DIAGNOSIS — Z882 Allergy status to sulfonamides status: Secondary | ICD-10-CM | POA: Diagnosis not present

## 2016-10-04 DIAGNOSIS — Z79899 Other long term (current) drug therapy: Secondary | ICD-10-CM | POA: Diagnosis not present

## 2016-10-04 LAB — CBC
HEMATOCRIT: 35.8 % — AB (ref 36.0–46.0)
Hemoglobin: 11.4 g/dL — ABNORMAL LOW (ref 12.0–15.0)
MCH: 23.7 pg — AB (ref 26.0–34.0)
MCHC: 31.8 g/dL (ref 30.0–36.0)
MCV: 74.4 fL — ABNORMAL LOW (ref 78.0–100.0)
Platelets: 454 10*3/uL — ABNORMAL HIGH (ref 150–400)
RBC: 4.81 MIL/uL (ref 3.87–5.11)
RDW: 17.4 % — AB (ref 11.5–15.5)
WBC: 5.6 10*3/uL (ref 4.0–10.5)

## 2016-10-04 LAB — BASIC METABOLIC PANEL
Anion gap: 6 (ref 5–15)
BUN: 7 mg/dL (ref 6–20)
CALCIUM: 9 mg/dL (ref 8.9–10.3)
CO2: 29 mmol/L (ref 22–32)
Chloride: 103 mmol/L (ref 101–111)
Creatinine, Ser: 0.73 mg/dL (ref 0.44–1.00)
GFR calc Af Amer: >60 mL/min (ref >60)
GFR calc non Af Amer: >60 mL/min (ref >60)
GLUCOSE: 97 mg/dL (ref 65–99)
POTASSIUM: 3.3 mmol/L — AB (ref 3.5–5.1)
SODIUM: 138 mmol/L (ref 135–145)

## 2016-10-04 LAB — HCG, SERUM, QUALITATIVE: Preg, Serum: NEGATIVE

## 2016-10-04 NOTE — Progress Notes (Signed)
BMP routed via epic done 10/04/16

## 2016-10-06 ENCOUNTER — Ambulatory Visit (HOSPITAL_COMMUNITY): Payer: Medicare Other | Admitting: Anesthesiology

## 2016-10-06 ENCOUNTER — Encounter (HOSPITAL_COMMUNITY): Payer: Self-pay | Admitting: *Deleted

## 2016-10-06 ENCOUNTER — Observation Stay (HOSPITAL_COMMUNITY)
Admission: RE | Admit: 2016-10-06 | Discharge: 2016-10-07 | Disposition: A | Discharge status: Home / Self Care | Payer: Medicare Other | Source: Ambulatory Visit | Attending: Surgery | Admitting: Surgery

## 2016-10-06 ENCOUNTER — Encounter (HOSPITAL_COMMUNITY)
Admission: RE | Disposition: A | Discharge status: Home / Self Care | Payer: Self-pay | Source: Ambulatory Visit | Attending: Surgery

## 2016-10-06 DIAGNOSIS — L732 Hidradenitis suppurativa: Secondary | ICD-10-CM | POA: Diagnosis not present

## 2016-10-06 DIAGNOSIS — Z882 Allergy status to sulfonamides status: Secondary | ICD-10-CM | POA: Diagnosis not present

## 2016-10-06 DIAGNOSIS — I10 Essential (primary) hypertension: Secondary | ICD-10-CM | POA: Diagnosis not present

## 2016-10-06 DIAGNOSIS — N760 Acute vaginitis: Secondary | ICD-10-CM | POA: Diagnosis not present

## 2016-10-06 DIAGNOSIS — K219 Gastro-esophageal reflux disease without esophagitis: Secondary | ICD-10-CM | POA: Diagnosis not present

## 2016-10-06 DIAGNOSIS — Z79899 Other long term (current) drug therapy: Secondary | ICD-10-CM | POA: Insufficient documentation

## 2016-10-06 HISTORY — PX: HYDRADENITIS EXCISION: SHX5243

## 2016-10-06 SURGERY — EXCISION, HIDRADENITIS, AXILLA
Anesthesia: General | Site: Axilla | Laterality: Left

## 2016-10-06 MED ORDER — IBUPROFEN 200 MG PO TABS: 600.0000 mg | ORAL_TABLET | Freq: Four times a day (QID) | ORAL | Status: DC | PRN

## 2016-10-06 MED ORDER — CHLORHEXIDINE GLUCONATE CLOTH 2 % EX PADS: 6.0000 | MEDICATED_PAD | Freq: Once | CUTANEOUS | Status: DC

## 2016-10-06 MED ORDER — FENTANYL CITRATE (PF) 100 MCG/2ML IJ SOLN
INTRAMUSCULAR | Status: DC | PRN
  Administered 2016-10-06 (×4): 50 ug via INTRAVENOUS

## 2016-10-06 MED ORDER — LACTATED RINGERS IV SOLN
INTRAVENOUS | Status: DC
  Administered 2016-10-06: 1000 mL via INTRAVENOUS
  Administered 2016-10-06: 09:00:00 via INTRAVENOUS

## 2016-10-06 MED ORDER — ACETAMINOPHEN 500 MG PO TABS
1000.0000 mg | ORAL_TABLET | ORAL | Status: AC
  Administered 2016-10-06: 1000 mg via ORAL
  Filled 2016-10-06: qty 2

## 2016-10-06 MED ORDER — KETOROLAC TROMETHAMINE 30 MG/ML IJ SOLN
INTRAMUSCULAR | Status: AC
  Filled 2016-10-06: qty 1

## 2016-10-06 MED ORDER — HYDROMORPHONE HCL 2 MG/ML IJ SOLN
0.5000 mg | INTRAMUSCULAR | Status: DC | PRN
  Administered 2016-10-07 (×2): 0.5 mg via INTRAVENOUS
  Filled 2016-10-06 (×2): qty 1

## 2016-10-06 MED ORDER — ONDANSETRON 4 MG PO TBDP: 4.0000 mg | ORAL_TABLET | Freq: Four times a day (QID) | ORAL | Status: DC | PRN

## 2016-10-06 MED ORDER — CEPHALEXIN 500 MG PO CAPS: 500.0000 mg | ORAL_CAPSULE | Freq: Four times a day (QID) | ORAL | 0 refills | Status: AC

## 2016-10-06 MED ORDER — FENTANYL CITRATE (PF) 100 MCG/2ML IJ SOLN
INTRAMUSCULAR | Status: AC
  Filled 2016-10-06: qty 2

## 2016-10-06 MED ORDER — HYDRALAZINE HCL 20 MG/ML IJ SOLN: 10.0000 mg | INTRAMUSCULAR | Status: DC | PRN

## 2016-10-06 MED ORDER — BUPIVACAINE HCL (PF) 0.25 % IJ SOLN
INTRAMUSCULAR | Status: DC | PRN
  Administered 2016-10-06: 30 mL

## 2016-10-06 MED ORDER — PROPOFOL 10 MG/ML IV BOLUS
INTRAVENOUS | Status: DC | PRN
  Administered 2016-10-06: 200 mg via INTRAVENOUS

## 2016-10-06 MED ORDER — DOCUSATE SODIUM 100 MG PO CAPS
100.0000 mg | ORAL_CAPSULE | Freq: Two times a day (BID) | ORAL | Status: DC
  Administered 2016-10-06 – 2016-10-07 (×2): 100 mg via ORAL
  Filled 2016-10-06 (×2): qty 1

## 2016-10-06 MED ORDER — ONDANSETRON HCL 4 MG/2ML IJ SOLN
INTRAMUSCULAR | Status: DC | PRN
  Administered 2016-10-06: 4 mg via INTRAVENOUS

## 2016-10-06 MED ORDER — MIDAZOLAM HCL 2 MG/2ML IJ SOLN
INTRAMUSCULAR | Status: AC
  Filled 2016-10-06: qty 2

## 2016-10-06 MED ORDER — CEFAZOLIN SODIUM-DEXTROSE 2-4 GM/100ML-% IV SOLN
2.0000 g | INTRAVENOUS | Status: AC
  Administered 2016-10-06: 2 g via INTRAVENOUS
  Filled 2016-10-06: qty 100

## 2016-10-06 MED ORDER — ENOXAPARIN SODIUM 40 MG/0.4ML ~~LOC~~ SOLN
40.0000 mg | SUBCUTANEOUS | Status: DC
  Administered 2016-10-07: 40 mg via SUBCUTANEOUS
  Filled 2016-10-06: qty 0.4

## 2016-10-06 MED ORDER — HYDROMORPHONE HCL 2 MG/ML IJ SOLN
0.2500 mg | INTRAMUSCULAR | Status: DC | PRN
  Administered 2016-10-06 (×4): 0.5 mg via INTRAVENOUS

## 2016-10-06 MED ORDER — LIDOCAINE 2% (20 MG/ML) 5 ML SYRINGE
INTRAMUSCULAR | Status: DC | PRN
  Administered 2016-10-06: 100 mg via INTRAVENOUS

## 2016-10-06 MED ORDER — GABAPENTIN 300 MG PO CAPS
300.0000 mg | ORAL_CAPSULE | ORAL | Status: AC
  Administered 2016-10-06: 300 mg via ORAL
  Filled 2016-10-06: qty 1

## 2016-10-06 MED ORDER — ONDANSETRON HCL 4 MG/2ML IJ SOLN
4.0000 mg | Freq: Four times a day (QID) | INTRAMUSCULAR | Status: DC | PRN
  Administered 2016-10-06 – 2016-10-07 (×2): 4 mg via INTRAVENOUS
  Filled 2016-10-06 (×2): qty 2

## 2016-10-06 MED ORDER — DEXAMETHASONE SODIUM PHOSPHATE 10 MG/ML IJ SOLN
INTRAMUSCULAR | Status: AC
  Filled 2016-10-06: qty 1

## 2016-10-06 MED ORDER — METHOCARBAMOL 500 MG PO TABS: 500.0000 mg | ORAL_TABLET | Freq: Four times a day (QID) | ORAL | Status: DC | PRN

## 2016-10-06 MED ORDER — DOCUSATE SODIUM 100 MG PO CAPS: 100.0000 mg | ORAL_CAPSULE | Freq: Two times a day (BID) | ORAL | 0 refills | Status: AC

## 2016-10-06 MED ORDER — TRIAMTERENE-HCTZ 37.5-25 MG PO CAPS
1.0000 | ORAL_CAPSULE | Freq: Every day | ORAL | Status: DC
  Filled 2016-10-06: qty 1

## 2016-10-06 MED ORDER — ACETAMINOPHEN 325 MG PO TABS
650.0000 mg | ORAL_TABLET | Freq: Four times a day (QID) | ORAL | Status: DC | PRN
  Administered 2016-10-07: 650 mg via ORAL
  Filled 2016-10-06 (×2): qty 2

## 2016-10-06 MED ORDER — PROPOFOL 10 MG/ML IV BOLUS
INTRAVENOUS | Status: AC
  Filled 2016-10-06: qty 20

## 2016-10-06 MED ORDER — OXYCODONE-ACETAMINOPHEN 5-325 MG PO TABS: 1.0000 | ORAL_TABLET | Freq: Four times a day (QID) | ORAL | 0 refills | Status: DC | PRN

## 2016-10-06 MED ORDER — KETOROLAC TROMETHAMINE 30 MG/ML IJ SOLN
INTRAMUSCULAR | Status: DC | PRN
  Administered 2016-10-06: 30 mg via INTRAVENOUS

## 2016-10-06 MED ORDER — DIPHENHYDRAMINE HCL 25 MG PO CAPS: 25.0000 mg | ORAL_CAPSULE | Freq: Four times a day (QID) | ORAL | Status: DC | PRN

## 2016-10-06 MED ORDER — MIDAZOLAM HCL 5 MG/5ML IJ SOLN
INTRAMUSCULAR | Status: DC | PRN
  Administered 2016-10-06: 2 mg via INTRAVENOUS

## 2016-10-06 MED ORDER — 0.9 % SODIUM CHLORIDE (POUR BTL) OPTIME
TOPICAL | Status: DC | PRN
  Administered 2016-10-06: 1000 mL

## 2016-10-06 MED ORDER — DIPHENHYDRAMINE HCL 50 MG/ML IJ SOLN: 25.0000 mg | Freq: Four times a day (QID) | INTRAMUSCULAR | Status: DC | PRN

## 2016-10-06 MED ORDER — BUPIVACAINE HCL (PF) 0.25 % IJ SOLN
INTRAMUSCULAR | Status: AC
  Filled 2016-10-06: qty 30

## 2016-10-06 MED ORDER — DEXAMETHASONE SODIUM PHOSPHATE 10 MG/ML IJ SOLN
INTRAMUSCULAR | Status: DC | PRN
  Administered 2016-10-06: 10 mg via INTRAVENOUS

## 2016-10-06 MED ORDER — LIDOCAINE 2% (20 MG/ML) 5 ML SYRINGE
INTRAMUSCULAR | Status: AC
  Filled 2016-10-06: qty 5

## 2016-10-06 MED ORDER — CEFAZOLIN SODIUM-DEXTROSE 2-4 GM/100ML-% IV SOLN
2.0000 g | Freq: Three times a day (TID) | INTRAVENOUS | Status: DC
  Administered 2016-10-06 – 2016-10-07 (×3): 2 g via INTRAVENOUS
  Filled 2016-10-06 (×7): qty 100

## 2016-10-06 MED ORDER — ONDANSETRON HCL 4 MG/2ML IJ SOLN
INTRAMUSCULAR | Status: AC
  Filled 2016-10-06: qty 2

## 2016-10-06 MED ORDER — BISACODYL 5 MG PO TBEC: 5.0000 mg | DELAYED_RELEASE_TABLET | Freq: Every day | ORAL | Status: DC | PRN

## 2016-10-06 MED ORDER — OXYCODONE HCL 5 MG PO TABS
5.0000 mg | ORAL_TABLET | ORAL | Status: DC | PRN
  Administered 2016-10-06 – 2016-10-07 (×2): 5 mg via ORAL
  Filled 2016-10-06 (×3): qty 1

## 2016-10-06 MED ORDER — PROMETHAZINE HCL 25 MG/ML IJ SOLN: 6.2500 mg | INTRAMUSCULAR | Status: DC | PRN

## 2016-10-06 MED ORDER — PANTOPRAZOLE SODIUM 40 MG PO TBEC
40.0000 mg | DELAYED_RELEASE_TABLET | Freq: Every day | ORAL | Status: DC
  Administered 2016-10-06 – 2016-10-07 (×2): 40 mg via ORAL
  Filled 2016-10-06 (×2): qty 1

## 2016-10-06 MED ORDER — HYDROMORPHONE HCL 2 MG/ML IJ SOLN
INTRAMUSCULAR | Status: AC
  Filled 2016-10-06: qty 1

## 2016-10-06 MED ORDER — MAGNESIUM HYDROXIDE 400 MG/5ML PO SUSP: 30.0000 mL | Freq: Every day | ORAL | Status: DC | PRN

## 2016-10-06 SURGICAL SUPPLY — 31 items
ADH SKN CLS APL DERMABOND .7 (GAUZE/BANDAGES/DRESSINGS) ×1
BLADE SURG SZ11 CARB STEEL (BLADE) ×3 IMPLANT
CHLORAPREP W/TINT 26ML (MISCELLANEOUS) ×3 IMPLANT
COVER SURGICAL LIGHT HANDLE (MISCELLANEOUS) ×3 IMPLANT
DECANTER SPIKE VIAL GLASS SM (MISCELLANEOUS) IMPLANT
DERMABOND ADVANCED (GAUZE/BANDAGES/DRESSINGS) ×2
DERMABOND ADVANCED .7 DNX12 (GAUZE/BANDAGES/DRESSINGS) ×1 IMPLANT
DRAPE LAPAROSCOPIC ABDOMINAL (DRAPES) ×3 IMPLANT
DRAPE LAPAROTOMY TRNSV 102X78 (DRAPE) IMPLANT
DRSG PAD ABDOMINAL 8X10 ST (GAUZE/BANDAGES/DRESSINGS) IMPLANT
ELECT REM PT RETURN 15FT ADLT (MISCELLANEOUS) ×3 IMPLANT
GAUZE SPONGE 4X4 12PLY STRL (GAUZE/BANDAGES/DRESSINGS) ×3 IMPLANT
GAUZE XEROFORM 1X8 LF (GAUZE/BANDAGES/DRESSINGS) ×3 IMPLANT
GLOVE BIO SURGEON STRL SZ 6 (GLOVE) ×3 IMPLANT
GLOVE INDICATOR 6.5 STRL GRN (GLOVE) ×3 IMPLANT
GLOVE SURG SS PI 7.0 STRL IVOR (GLOVE) ×3 IMPLANT
GOWN STRL REUS W/TWL LRG LVL3 (GOWN DISPOSABLE) ×3 IMPLANT
GOWN STRL REUS W/TWL XL LVL3 (GOWN DISPOSABLE) ×3 IMPLANT
KIT BASIN OR (CUSTOM PROCEDURE TRAY) ×3 IMPLANT
LUBRICANT JELLY K Y 4OZ (MISCELLANEOUS) IMPLANT
PACK GENERAL/GYN (CUSTOM PROCEDURE TRAY) ×3 IMPLANT
PAD ABD 8X10 STRL (GAUZE/BANDAGES/DRESSINGS) ×3 IMPLANT
SUT ETHILON 2 0 PS N (SUTURE) ×6 IMPLANT
SUT MNCRL AB 4-0 PS2 18 (SUTURE) ×3 IMPLANT
SUT VIC AB 3-0 SH 18 (SUTURE) ×6 IMPLANT
SUT VICRYL 3 0 (SUTURE) IMPLANT
SWAB COLLECTION DEVICE MRSA (MISCELLANEOUS) IMPLANT
SWAB CULTURE ESWAB REG 1ML (MISCELLANEOUS) IMPLANT
TAPE CLOTH SURG 4X10 WHT LF (GAUZE/BANDAGES/DRESSINGS) ×3 IMPLANT
TOWEL OR 17X26 10 PK STRL BLUE (TOWEL DISPOSABLE) ×3 IMPLANT
TOWEL OR NON WOVEN STRL DISP B (DISPOSABLE) ×3 IMPLANT

## 2016-10-06 NOTE — Anesthesia Preprocedure Evaluation (Signed)
Anesthesia Evaluation  Patient identified by MRN, date of birth, ID band Patient awake    Reviewed: Allergy & Precautions, NPO status , Patient's Chart, lab work & pertinent test results  Airway Mallampati: I  TM Distance: >3 FB Neck ROM: Full    Dental  (+) Teeth Intact   Pulmonary neg pulmonary ROS,    breath sounds clear to auscultation       Cardiovascular hypertension,  Rhythm:Regular Rate:Normal     Neuro/Psych negative neurological ROS  negative psych ROS   GI/Hepatic Neg liver ROS, GERD  ,  Endo/Other  negative endocrine ROS  Renal/GU negative Renal ROS  negative genitourinary   Musculoskeletal negative musculoskeletal ROS (+)   Abdominal   Peds negative pediatric ROS (+)  Hematology negative hematology ROS (+)   Anesthesia Other Findings   Reproductive/Obstetrics negative OB ROS                             Anesthesia Physical Anesthesia Plan  ASA: II  Anesthesia Plan: General   Post-op Pain Management:    Induction: Intravenous  Airway Management Planned: Oral ETT  Additional Equipment:   Intra-op Plan:   Post-operative Plan: Extubation in OR  Informed Consent: I have reviewed the patients History and Physical, chart, labs and discussed the procedure including the risks, benefits and alternatives for the proposed anesthesia with the patient or authorized representative who has indicated his/her understanding and acceptance.     Plan Discussed with: CRNA  Anesthesia Plan Comments:         Anesthesia Quick Evaluation

## 2016-10-06 NOTE — Op Note (Signed)
Operative Note  Christy Huang  [id number]  [id number]  10/06/2016   Surgeon: Berna Bue  Assistant: OR staff  Procedure performed: Excision of left axillary hidradenitis, complex closure of wound 10x20cm  Preop diagnosis: left axillary hidradenitis Post-op diagnosis/intraop findings: same  Specimens: left axillary hidradenitis Retained items: none EBL: 30cccc Complications: none  Description of procedure: After obtaining informed consent the patient was taken to the operating room and placed supine on operating room table wheregeneral  Anesthesia with LMA was initiated, preoperative antibiotics were administered, SCDs applied, and a formal timeout was performed. She was positioned with the left arm abducted and over head, all pressure points padded appropriately, and the left breast taped to the right side. The patient's left axilla was prepped and draped in usual sterile fashion and the area to be excised, to include all the hairbearing skin and prior scar tissue, was marked. The skin was incised sharply and then electrocautery was used to dissect down into the soft tissues and excise the specimen with a margin of subcutaneous fat. The clavipectoral fascia was not violated or visualized. Hemostasis was ensured and the wound using cautery and in 1 instance figure-of-eight suture of 3-0 Vicryl. At this point the wound was approximately 20 cm long by 10-15 cm wide and irregular in shape. I proceeded to serially close the edges using interrupted deep dermal sutures of 3-0 Vicryl. In the central aspect of the wound, the irregular shape of the excision site made it more feasible to reapproximate the skin edges perpendicularly, thus the final incision appears t shaped. The skin closure was reinforced with interrupted 2-0 nylon vertical mattress sutures to relieve tension on the skin edges.  The incisions were then dressed with xeroform, gauze fluffs, ABD and tape. There was minimal  tension on the incision once the patient was taken out of surgical positioning. The patient was then awakened, extubated and taken to PACU in stable condition.   All counts were correct at the completion of the case.

## 2016-10-06 NOTE — Discharge Instructions (Signed)
[facility] Office Phone Number [phone]  POST OP INSTRUCTIONS  Always review your discharge instruction sheet given to you by the facility where your surgery was performed.  IF YOU HAVE DISABILITY OR FAMILY LEAVE FORMS, YOU MUST BRING THEM TO THE OFFICE FOR PROCESSING.  DO NOT GIVE THEM TO YOUR DOCTOR.  1. A prescription for pain medication may be given to you upon discharge.  Take your pain medication as prescribed, if needed.  If narcotic pain medicine is not needed, then you may take acetaminophen (Tylenol) or ibuprofen (Advil) as needed. 2. Take your usually prescribed medications unless otherwise directed 3. If you need a refill on your pain medication, please contact your pharmacy.  They will contact our office to request authorization.  Prescriptions will not be filled after 5pm or on week-ends. 4. You should eat very light the first 24 hours after surgery, such as soup, crackers, pudding, etc.  Resume your normal diet the day after surgery. 5. Most patients will experience some swelling and bruising in the surgical site.  Ice packs and a good support bra will help.  Swelling and bruising can take several days to resolve. Limit left arm to gentle range of motion exercises.  6. It is common to experience some constipation if taking pain medication after surgery.  Increasing fluid intake and taking a stool softener will usually help or prevent this problem from occurring.  A mild laxative (Milk of Magnesia or Miralax) should be taken according to package directions if there are no bowel movements after 48 hours. 7. Unless discharge instructions indicate otherwise, you may remove your bandages 24-48 hours after surgery, and you may shower at that time. OK to put antibiotic ointment on the incisions and keep covered with a dry gauze dressing, change this daily. Keep the area clean and dry as best you can.  Any sutures or staples will be removed at the office during your  follow-up visits.  8. ACTIVITIES:  You may resume regular daily activities (gradually increasing) beginning the next day.  Wearing a good support bra or sports bra minimizes pain and swelling.  You may have sexual intercourse when it is comfortable. a. You may drive when you no longer are taking prescription pain medication, you can comfortably wear a seatbelt, and you can safely maneuver your car and apply brakes. b. RETURN TO WORK:  ____pending follow up appointment__________________________________________________________________________________ 9. You should see your doctor in the office for a follow-up appointment approximately one week after your surgery.   10. OTHER INSTRUCTIONS: _______Your follow up appointment is Friday, April 20th at 1:30pm. Please arrive at 1:15.________________________________________________________________________________________ _____________________________________________________________________________________________________________________________________ _____________________________________________________________________________________________________________________________________ _____________________________________________________________________________________________________________________________________  WHEN TO CALL YOUR DOCTOR: 1. Fever over 101.0 2. Nausea and/or vomiting. 3. Extreme swelling or bruising. 4. Continued bleeding from incision. 5. Increased pain, redness, or drainage from the incision.  The clinic staff is available to answer your questions during regular business hours.  Please dont hesitate to call and ask to speak to one of the nurses for clinical concerns.  If you have a medical emergency, go to the nearest emergency room or call 911.  A surgeon from [facility] is always on call at the hospital.  For further questions, please visit [email or web address]

## 2016-10-06 NOTE — Anesthesia Procedure Notes (Signed)
Procedure Name: LMA Insertion Date/Time: 10/06/2016 9:13 AM Performed by: Leroy Libman L Patient Re-evaluated:Patient Re-evaluated prior to inductionOxygen Delivery Method: Circle system utilized Preoxygenation: Pre-oxygenation with 100% oxygen Intubation Type: IV induction Ventilation: Mask ventilation without difficulty LMA: LMA inserted LMA Size: 4.0 Placement Confirmation: positive ETCO2 and breath sounds checked- equal and bilateral Dental Injury: Teeth and Oropharynx as per pre-operative assessment

## 2016-10-06 NOTE — H&P (Signed)
[name] Patient #: [id number] DOB: 1980-08-07 Single / Language: Lenox Ponds / Race: Black or African American Female  History of Present Illness  Patient words: Ms. Pinder returns for follow-up status post excision of right axillary hidradenitis in November as well as to discuss excision of the left side. The right side wound had dehisced little bit but has now completely healed and scarred in. She does have some stiffness of that skin and minimal pain. She is also expressed interest in having a sleeve gastrectomy and is scheduled for an information session soon. She's had a lot of stress lately has her mother is going on dialysis.     Allergies  Sulfa 10 *OPHTHALMIC AGENTS*  Medication History  Biotin ( Tablet, Oral) Active. Carvedilol (12.5MG  Tablet, Oral) Active. Clindamycin Phosphate (1% Solution, External) Active. Doxycycline Hyclate (  Tablet, Oral) Active. Omega 3 (  Capsule, Oral) Active. Omeprazole (  Capsule DR, Oral) Active. Terconazole (0.4% Cream, Vaginal) Active. Triamterene-HCTZ (37.5-25MG  Capsule, Oral) Active. Medications Reconciled     Review of Systems  All other systems negative  Vitals:   10/06/16 0804  BP: 134/81  Pulse: 77  Resp: 18  Temp: 98 F (36.7 C)      Physical Exam  The physical exam findings are as follows: Note:She is alert and oriented, no distress Unlabored respirations, symmetrical air entry Regular rate and rhythm, no pedal edema Abdomen soft nontender nondistended extremities warm without deformity In the right axilla there is stiff scar tissue but no residual wound, no active inflammation. 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 The wound had opened up a bit and has now healed in the form of a stiff scar. We  talked about range of motion exercises and scar massage to help loosen that up a little bit. She desires excision on the left side. We discussed once again the nature of that surgery and the risks involved as well as the risk of having an open wound to begin with given her complications on the right side. She understands and is eager to proceed.

## 2016-10-06 NOTE — Transfer of Care (Signed)
Immediate Anesthesia Transfer of Care Note  Patient: Christy Huang  Procedure(s) Performed: Procedure(s): EXCISION OF LEFT AXILLARY HIDRADENITIS (Left)  Patient Location: PACU  Anesthesia Type:General  Level of Consciousness: awake, alert  and oriented  Airway & Oxygen Therapy: Patient Spontanous Breathing and Patient connected to face mask oxygen  Post-op Assessment: Report given to RN and Post -op Vital signs reviewed and stable  Post vital signs: Reviewed and stable  Last Vitals:  Vitals:   10/06/16 0804  BP: 134/81  Pulse: 77  Resp: 18  Temp: 36.7 C    Last Pain:  Vitals:   10/06/16 0804  TempSrc: Oral      Patients Stated Pain Goal: 3 (10/06/16 0843)  Complications: No apparent anesthesia complications

## 2016-10-07 DIAGNOSIS — Z882 Allergy status to sulfonamides status: Secondary | ICD-10-CM | POA: Diagnosis not present

## 2016-10-07 DIAGNOSIS — L732 Hidradenitis suppurativa: Secondary | ICD-10-CM | POA: Diagnosis not present

## 2016-10-07 DIAGNOSIS — Z79899 Other long term (current) drug therapy: Secondary | ICD-10-CM | POA: Diagnosis not present

## 2016-10-07 DIAGNOSIS — K219 Gastro-esophageal reflux disease without esophagitis: Secondary | ICD-10-CM | POA: Diagnosis not present

## 2016-10-07 DIAGNOSIS — I10 Essential (primary) hypertension: Secondary | ICD-10-CM | POA: Diagnosis not present

## 2016-10-07 MED ORDER — TRIAMTERENE-HCTZ 37.5-25 MG PO TABS
1.0000 | ORAL_TABLET | Freq: Every day | ORAL | Status: DC
  Administered 2016-10-07: 1 via ORAL
  Filled 2016-10-07: qty 1

## 2016-10-07 MED ORDER — OXYCODONE HCL 5 MG PO TABS: 5.0000 mg | ORAL_TABLET | ORAL | 0 refills | Status: DC | PRN

## 2016-10-07 NOTE — Progress Notes (Signed)
Pt's vitals are WNL, tolerating diet and pain is under control. Discussed discharge instructions with patient. All questions and concerns address. Discharge to home with prescriptions.

## 2016-10-07 NOTE — Discharge Summary (Addendum)
Physician Discharge Summary [facility]  Patient ID: Christy Huang MRN: [id number] DOB/AGE: 1981-06-20 35 y.o.  Admit date: 10/06/2016 Discharge date: 10/07/2016  Admission Diagnoses:  Left axillary hidradenitis  Discharge Diagnoses:  Active Problems:   Hidradenitis axillaris   Discharged Condition: good  Hospital Course: Patient was admitted for observation following left axillary surgery.  Post op course was uncomplicated.  Pain was well controlled.  Tolerated diet.  Patient was prepared for discharge home on POD#1.  Consults: None  Treatments: surgery: excision left axillary hidradenitis  Discharge Exam: Blood pressure (!) 111/98, pulse (!) 51, temperature 97.8 F (36.6 C), temperature source Oral, resp. rate 18, height  (1.575 m), weight 92.5 kg (204 lb), SpO2 100 %. HEENT - clear Neck - soft Chest - clear bilaterally Cor - RRR Axilla - right well healed; left with intact suture line, dry - dressing replaced  Disposition: Home  NCCSRS reviewed. This patient shows no habitual signs of abuse or chronic narcotic prescription use.  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    Discharge Instructions    Change dressing (specify)    Complete by:  As directed    Cover wound with dry dressing.  Change as needed.   Diet - low sodium heart healthy    Complete by:  As directed    Discharge instructions    Complete by:  As directed    [facility] -- DISCHARGE INSTRUCTIONS  REMINDER:   Carry a list of your medications and allergies with you at all times  Call your pharmacy at least 1 week in advance to refill prescriptions  Do not mix any prescribed pain medicine  with alcohol  Do not drive any motor vehicles while taking pain medication  Take medications with food unless otherwise directed  Follow-up appointments (date to return to physician): Please call [phone] to confirm your follow up appointment with your surgeon.  Call your Surgeon if you have:  Temperature greater than 101.0  Persistent nausea and vomiting  Severe uncontrolled pain  Redness, tenderness, or signs of infection (pain, swelling, redness, odor or    green/yellow discharge around the site)  Difficulty breathing, headache or visual disturbances  Hives  Persistent dizziness or light-headedness  Any other questions or concerns you may have after discharge  In an emergency, call 911 or go to an Emergency Department at a nearby hospital.   Diet: Begin with liquids, and if they are tolerated, resume your usual diet.  Avoid spicy, greasy or heavy foods.  If you have nausea or vomiting, go back to liquids.  If you cannot keep liquids down, call your doctor.  Avoid alcohol consumption while on prescription pain medications. Good nutrition promotes healing. Increase fiber and fluids.   ADDITIONAL INSTRUCTIONS: Cover wound with dry gauze.  May shower.  Call office for appointment.  Velora Heckler, MD, [facility] Office: [phone]   Increase activity slowly    Complete by:  As directed      Allergies as of 10/07/2016      Reactions   Other Hives   Allergic to TB test per patient    Sulfa Antibiotics Hives   Reaction "I just can't have it"  Medication List    STOP taking these medications   clindamycin 1 % external solution Commonly known as:  CLEOCIN T   doxycycline 100 MG tablet Commonly known as:  VIBRA-TABS     TAKE these medications   cephALEXin 500 MG capsule Commonly known as:  KEFLEX Take 1 capsule (500 mg total) by mouth 4 (four) times daily.   docusate sodium 100 MG capsule Commonly known as:  COLACE Take 1 capsule  (100 mg total) by mouth 2 (two) times daily.   Fish Oil 1000 MG Caps Take 1,000 mg by mouth daily.   Ginkgo Biloba 60 MG Tabs Take 1 tablet by mouth daily.   HAIR SKIN AND NAILS FORMULA Tabs Take 2 tablets by mouth daily.   omeprazole 20 MG capsule Commonly known as:  PRILOSEC Take 20 mg by mouth daily as needed (heartburn).   oxyCODONE 5 MG immediate release tablet Commonly known as:  Oxy IR/ROXICODONE Take 1 tablet (5 mg total) by mouth every 4 (four) hours as needed for moderate pain.   oxyCODONE-acetaminophen 5-325 MG tablet Commonly known as:  PERCOCET/ROXICET Take 1 tablet by mouth every 6 (six) hours as needed for severe pain.   triamterene-hydrochlorothiazide 37.5-25 MG capsule Commonly known as:  DYAZIDE TAKE 1 EACH (1 CAPSULE TOTAL) BY MOUTH EVERY MORNING.      Follow-up Information    Berna Bue, MD Follow up in 1 week(s).   Specialty:  General Surgery Why:  Appointment scheduled for Friday April 20 at 1:30pm. Please arrive at 1:15pm. Contact information: [phone]           Velora Heckler, MD, [facility] Office: [phone]   Signed: Velora Heckler 10/07/2016, 8:44 AM

## 2016-10-07 NOTE — Care Management Obs Status (Signed)
MEDICARE OBSERVATION STATUS NOTIFICATION   Patient Details  Name: Christy Huang MRN: [id number] Date of Birth: 1980-07-15   Medicare Observation Status Notification Given:  Yes    Elliot Cousin, RN 10/07/2016, 12:30 PM

## 2016-10-09 NOTE — Anesthesia Postprocedure Evaluation (Signed)
Anesthesia Post Note  Patient: Christy Huang  Procedure(s) Performed: Procedure(s) (LRB): EXCISION OF LEFT AXILLARY HIDRADENITIS (Left)  Patient location during evaluation: PACU Anesthesia Type: General Level of consciousness: awake and alert Pain management: pain level controlled Vital Signs Assessment: post-procedure vital signs reviewed and stable Respiratory status: spontaneous breathing, nonlabored ventilation, respiratory function stable and patient connected to nasal cannula oxygen Cardiovascular status: blood pressure returned to baseline and stable Postop Assessment: no signs of nausea or vomiting Anesthetic complications: no       Last Vitals:  Vitals:   10/07/16 0551 10/07/16 1500  BP: (!) 111/98 110/82  Pulse: (!) 51 (!) 55  Resp: 18 16  Temp: 36.6 C 36.2 C    Last Pain:  Vitals:   10/07/16 1500  TempSrc: Oral  PainSc:                  Jazzalynn Rhudy,JAMES TERRILL

## 2016-10-16 ENCOUNTER — Other Ambulatory Visit: Payer: Self-pay | Admitting: Obstetrics

## 2016-10-16 DIAGNOSIS — Z0000 Encounter for general adult medical examination without abnormal findings: Secondary | ICD-10-CM

## 2016-10-16 DIAGNOSIS — L732 Hidradenitis suppurativa: Secondary | ICD-10-CM

## 2016-10-16 DIAGNOSIS — I10 Essential (primary) hypertension: Secondary | ICD-10-CM

## 2016-10-17 ENCOUNTER — Encounter: Payer: Self-pay | Admitting: Obstetrics

## 2016-10-20 ENCOUNTER — Other Ambulatory Visit (HOSPITAL_COMMUNITY): Payer: Self-pay | Admitting: Surgery

## 2016-11-09 ENCOUNTER — Ambulatory Visit (HOSPITAL_COMMUNITY)
Admission: RE | Admit: 2016-11-09 | Discharge: 2016-11-09 | Disposition: A | Discharge status: Home / Self Care | Payer: Medicare Other | Source: Ambulatory Visit | Attending: Surgery | Admitting: Surgery

## 2016-11-09 ENCOUNTER — Other Ambulatory Visit: Payer: Self-pay

## 2016-11-09 DIAGNOSIS — R011 Cardiac murmur, unspecified: Secondary | ICD-10-CM | POA: Diagnosis not present

## 2016-11-09 DIAGNOSIS — Z01818 Encounter for other preprocedural examination: Secondary | ICD-10-CM | POA: Diagnosis not present

## 2016-11-09 DIAGNOSIS — I10 Essential (primary) hypertension: Secondary | ICD-10-CM | POA: Diagnosis not present

## 2016-11-23 ENCOUNTER — Encounter: Payer: Medicare Other | Attending: Surgery | Admitting: Registered"

## 2016-11-23 ENCOUNTER — Encounter: Payer: Self-pay | Admitting: Registered"

## 2016-11-23 DIAGNOSIS — E669 Obesity, unspecified: Secondary | ICD-10-CM

## 2016-11-23 DIAGNOSIS — Z713 Dietary counseling and surveillance: Secondary | ICD-10-CM | POA: Diagnosis not present

## 2016-11-23 DIAGNOSIS — Z6834 Body mass index (BMI) 34.0-34.9, adult: Secondary | ICD-10-CM | POA: Insufficient documentation

## 2016-11-23 NOTE — Progress Notes (Addendum)
Pre-Op Assessment Visit:  Pre-Operative Sleeve Gastrectomy Surgery  Medical Nutrition Therapy:  Appt start time: 9:52  End time:  11:05  Patient was seen on 11/23/2016 for Pre-Operative Nutrition Assessment. [facility] on 11/23/2016.   Pt expectation of surgery: reduce risk of sickness and disease when older  Pt expectation of Dietitian: none stated  Start weight at [facility]: 194.1 BMI: 34.38   Pt states she is unsure about surgery and wants to be in a healthier position to be a kidney donor for mom. Pt states due to hypertension, she is unable to donate her kidney and having bariatric surgery will improve her hypertension and position her to possibly donate her kidney to mom. Pt states she is the "glue that keeps everything together", caring for her mom as well as her children. Pt states her children are soon to be 18, 14, and 6. Pt states she is concerned about staples being left inside of you related to sleeve gastrectomy. Pt states the staples are "man-made and doesn't want to be a lab rat". Pt states she exercises at least 35 min on treadmill, 5 times a week. Pt states she is a fein with working out. Pt reports she has a fear of being "big". 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 Pt reports eating really small things to settle growling of stomach and feels sad when she feels like she overeats. Pt states she hasn't really thought about all the details of the surgery, therefore didn't come prepared. Pt states her mind is nervous and she thinking she's not going to get it.   Per insurance, pt needs 6 SWL visits prior to surgery.  Information about Protein Shakes and Vitamin and Mineral Recommendations need to be covered at next visit.   24 hr Dietary Recall: First Meal: 2 slices of bacon or sausage, scrambled eggs, applesauce or fruit, waffles with cream cheese Snack: fruit and nut granola bars,  nutrigrain bars Second Meal: fish, spinach dip or grilled chicken with salad Snack: nut and cheese packs Third Meal: sometimes doesn't eat  Snack: none Beverages: water   Encouraged to engage in 150 minutes of moderate physical activity including cardiovascular and weight baring weekly  Handouts given during visit include:  . Pre-Op Goals . Bariatric Surgery Protein Shakes . Vitamin and Mineral Recommendations  During the appointment today the following Pre-Op Goals were reviewed with the patient: . Maintain or lose weight as instructed by your surgeon . Make healthy food choices . Begin to limit portion sizes . Limited concentrated sugars and fried foods . Keep fat/sugar in the single digits per serving on          food labels . Practice CHEWING your food  (aim for 30 chews per bite or until applesauce consistency) . Practice not drinking 15 minutes before, during, and 30 minutes after each meal/snack . Avoid all carbonated beverages  . Avoid/limit caffeinated beverages  . Avoid all sugar-sweetened beverages . Consume 3 meals per day; eat every 3-5 hours . Make a list of non-food related activities . Aim for 64-100 ounces of FLUID daily  . Aim for at least 60-80 grams of PROTEIN daily . Look for a liquid protein source that contain ?15 g protein and ?5 g carbohydrate  (ex: shakes, drinks, shots) . Physical activity is an important part of a healthy lifestyle so keep it moving!  Follow diet recommendations listed below Energy and Macronutrient  Recommendations: Calories: 1600 Carbohydrate: 180 Protein: 120 Fat: 44  Demonstrated degree of understanding via:  Teach Back   Teaching Method Utilized:  Visual Auditory Hands on  Barriers to learning/adherence to lifestyle change: none  Patient to call the [facility] to enroll in Pre-Op and Post-Op Nutrition Education when surgery date is scheduled.

## 2016-11-24 NOTE — Addendum Note (Signed)
Addendum  created 11/24/16 1345 by Neveen Daponte, MD   Sign clinical note    

## 2016-11-24 NOTE — Anesthesia Postprocedure Evaluation (Signed)
Anesthesia Post Note  Patient: Christy Huang  Procedure(s) Performed: Procedure(s) (LRB): EXCISION OF LEFT AXILLARY HIDRADENITIS (Left)     Anesthesia Post Evaluation  Last Vitals:  Vitals:   10/07/16 0551 10/07/16 1500  BP: (!) 111/98 110/82  Pulse: (!) 51 (!) 55  Resp: 18 16  Temp: 36.6 C 36.2 C    Last Pain:  Vitals:   10/07/16 1500  TempSrc: Oral  PainSc:                  Anner Baity,JAMES TERRILL

## 2016-12-21 ENCOUNTER — Encounter: Payer: Self-pay | Admitting: Registered"

## 2016-12-21 ENCOUNTER — Encounter: Payer: Medicare Other | Attending: Surgery | Admitting: Registered"

## 2016-12-21 DIAGNOSIS — E669 Obesity, unspecified: Secondary | ICD-10-CM

## 2016-12-21 DIAGNOSIS — Z6834 Body mass index (BMI) 34.0-34.9, adult: Secondary | ICD-10-CM | POA: Insufficient documentation

## 2016-12-21 DIAGNOSIS — Z713 Dietary counseling and surveillance: Secondary | ICD-10-CM | POA: Diagnosis not present

## 2016-12-21 NOTE — Patient Instructions (Addendum)
-   Aim to chew at least 30 times per bite or to applesauce consistency.   - Aim for 64 oz fluid a day.   - Add in vegetables with dinner. 

## 2016-12-21 NOTE — Progress Notes (Signed)
Appt start time: 2:00 end time: 2:18  Assessment: 1st SWL Appointment.   Start Wt at [facility]: 194.1 Wt: 194.5 BMI: 34.45   Pt arrives having maintained weight from previous visit. Pt states uncertainty of following through with surgery. Pt states she doesn't eat dinner because of heartburn; also had coffee the day of heartburn complications. Pt states she drinks mostly water; juice makes her :feel sick on the stomach". Pt states she likes to cook. Pt reports exercise is her way of managing stress and emotions. Pt reports using sauna 30 min, 5 days/week.  Per insurance, pt needs 6 SWL visits prior to surgery.  Information about Vitamin and Mineral Recommendations need to be covered at next visit.    MEDICATIONS: See list   DIETARY INTAKE:  24-hr recall:  B ( AM): 1/2 kilebasa sausage, eggs, 2 pks oatmeal Snk ( AM): none  L ( PM): [employer]-burger, few fries Snk ( PM): none D ( PM): crackers and sometimes turkey, egg, cheese, wheat bread Snk ( PM): sometimes nutrigrain bar, nuts with fruit, muddie buddies Beverages: water  Usual physical activity: 35 min treadmill, 30 min machines 5 days/wk  Diet to Follow: Calories: 1600 calories Carbohydrate: 180g Protein: 120g Fat: 44g   Preferred Learning Style:   No preference indicated   Learning Readiness:   Contemplating  Ready  Change in progress     Nutritional Diagnosis:  [place]-3.3 Overweight/obesity related to past poor dietary habits and physical inactivity as evidenced by patient w/ planned sleeve gastrectomy surgery following dietary guidelines for continued weight loss.    Intervention:  Nutrition counseling for upcoming Bariatric Surgery.  Goals:  - Aim to chew at least 30 times per bite or to applesauce consistency.  - Aim for 64 oz fluid a day.  - Add in vegetables with dinner.   Teaching Method Utilized:  Visual Auditory Hands on  Handouts given during visit include:  Protein Shakes  Barriers to  learning/adherence to lifestyle change: none  Demonstrated degree of understanding via:  Teach Back   Monitoring/Evaluation:  Dietary intake, exercise, and body weight in 1 month(s).  

## 2017-01-23 ENCOUNTER — Ambulatory Visit: Payer: Medicare Other | Admitting: Registered"

## 2017-01-23 NOTE — Progress Notes (Deleted)
Appt start time: 2:00 end time: 2:18  Assessment: 1st SWL Appointment.   Start Wt at [facility]: 194.1 Wt: 194.5 BMI: 34.45   Pt arrives having maintained weight from previous visit. Pt states uncertainty of following through with surgery. Pt states she doesn't eat dinner because of heartburn; also had coffee the day of heartburn complications. Pt states she drinks mostly water; juice makes her :feel sick on the stomach". Pt states she likes to cook. Pt reports exercise is her way of managing stress and emotions. Pt reports using sauna 30 min, 5 days/week.  Per insurance, pt needs 6 SWL visits prior to surgery.  Information about Vitamin and Mineral Recommendations need to be covered at next visit.    MEDICATIONS: See list   DIETARY INTAKE:  24-hr recall:  B ( AM): 1/2 kilebasa sausage, eggs, 2 pks oatmeal Snk ( AM): none  L ( PM): [employer]-burger, few fries Snk ( PM): none D ( PM): crackers and sometimes [place], egg, cheese, wheat bread Snk ( PM): sometimes nutrigrain bar, nuts with fruit, muddie buddies Beverages: water  Usual physical activity: 35 min treadmill, 30 min machines 5 days/wk  Diet to Follow: Calories: 1600 calories Carbohydrate: 180g Protein: 120g Fat: 44g   Preferred Learning Style:   No preference indicated   Learning Readiness:   Contemplating  Ready  Change in progress     Nutritional Diagnosis:  [place]-3.3 Overweight/obesity related to past poor dietary habits and physical inactivity as evidenced by patient w/ planned sleeve gastrectomy surgery following dietary guidelines for continued weight loss.    Intervention:  Nutrition counseling for upcoming Bariatric Surgery.  Goals:  - Aim to chew at least 30 times per bite or to applesauce consistency.  - Aim for 64 oz fluid a day.  - Add in vegetables with dinner.   Teaching Method Utilized:  Visual Auditory Hands on  Handouts given during visit include:  Protein Shakes  Barriers to  learning/adherence to lifestyle change: none  Demonstrated degree of understanding via:  Teach Back   Monitoring/Evaluation:  Dietary intake, exercise, and body weight in 1 month(s).

## 2017-01-25 ENCOUNTER — Encounter: Payer: Medicare Other | Attending: Surgery | Admitting: Registered"

## 2017-01-25 ENCOUNTER — Encounter: Payer: Self-pay | Admitting: Registered"

## 2017-01-25 DIAGNOSIS — Z713 Dietary counseling and surveillance: Secondary | ICD-10-CM | POA: Insufficient documentation

## 2017-01-25 DIAGNOSIS — E669 Obesity, unspecified: Secondary | ICD-10-CM

## 2017-01-25 DIAGNOSIS — Z6834 Body mass index (BMI) 34.0-34.9, adult: Secondary | ICD-10-CM | POA: Diagnosis not present

## 2017-01-25 NOTE — Patient Instructions (Signed)
-   Aim to chew at least 30 times per bite or to applesauce consistency.   - Aim for 64 oz fluid a day.   - Add in vegetables with dinner.

## 2017-01-25 NOTE — Progress Notes (Signed)
Appt start time: 2:05 end time: 2:30  Assessment: 2nd SWL Appointment.   Start Wt at [facility]: 194.1 Wt: 195.8 BMI: 34.68   Pt arrives having gained 1.3 lbs from previous visit. Pt states she is set on surgery because she paid her money today. Pt states she has been stressed lately due to car and relationship issues. Pt states she "went a week without exercising but now back in the gym." Pt was informed of BMI being below bariatric surgery requirements.   Per insurance, pt needs 6 SWL visits prior to surgery.    MEDICATIONS: See list   DIETARY INTAKE:  24-hr recall:  B ( AM): 1/2 kilebasa sausage, eggs, 2 pks oatmeal Snk ( AM): none  L ( PM): [employer]-burger, few fries Snk ( PM): none D ( PM): crackers and sometimes [place], egg, cheese, wheat bread Snk ( PM): sometimes nutrigrain bar, nuts with fruit, muddie buddies Beverages: water, coffee  Usual physical activity: 35 min treadmill, 30 min machines 5 days/wk  Diet to Follow: Calories: 1600 calories Carbohydrate: 180g Protein: 120g Fat: 44g   Preferred Learning Style:   No preference indicated   Learning Readiness:   Contemplating  Ready  Change in progress     Nutritional Diagnosis:  [place]-3.3 Overweight/obesity related to past poor dietary habits and physical inactivity as evidenced by patient w/ planned sleeve gastrectomy surgery following dietary guidelines for continued weight loss.    Intervention:  Nutrition counseling for upcoming Bariatric Surgery.  Goals:  - Aim to chew at least 30 times per bite or to applesauce consistency.  - Aim for 64 oz fluid a day.  - Add in vegetables with dinner.   Teaching Method Utilized:  Visual Auditory Hands on  Handouts given during visit include:  Vitamin and Mineral Recommendations  Barriers to learning/adherence to lifestyle change: none  Demonstrated degree of understanding via:  Teach Back   Monitoring/Evaluation:  Dietary intake, exercise, and body  weight in 1 month(s).

## 2017-03-01 ENCOUNTER — Encounter: Payer: Self-pay | Admitting: Registered"

## 2017-03-01 ENCOUNTER — Encounter: Payer: Medicare Other | Attending: Surgery | Admitting: Registered"

## 2017-03-01 DIAGNOSIS — Z713 Dietary counseling and surveillance: Secondary | ICD-10-CM | POA: Insufficient documentation

## 2017-03-01 DIAGNOSIS — Z6834 Body mass index (BMI) 34.0-34.9, adult: Secondary | ICD-10-CM | POA: Diagnosis not present

## 2017-03-01 DIAGNOSIS — E669 Obesity, unspecified: Secondary | ICD-10-CM

## 2017-03-01 NOTE — Patient Instructions (Addendum)
-   Aim to eat breakfast in the morning even when on-the-go. Grab a meal replacement shake or yogurt or granola bar with a piece of fruit.   - Aim to eat 3 meals a day and add some snacks.

## 2017-03-01 NOTE — Progress Notes (Signed)
Appt start time: 3:00 end time: 3:27  Assessment: 3rd SWL Appointment.   Start Wt at [facility]: 194.1 Wt: 194.1 BMI: 34.38   Pt arrives 12 min late. Pt arrives having maintained weight from previous visit. 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 Pt states she has not been to the gym much lately because she is having car issues. Pt states she would like help with meal plans and emotional eating. Pt states she will emotionally eat sometimes but still portions food well when she does. Pt states she tries not to eat after 7pm.    MEDICATIONS: See list   DIETARY INTAKE:  24-hr recall:  B ( AM): 1/2 kilebasa sausage, eggs, 2 pks oatmeal Snk ( AM): none  L ( PM): [employer]-burger, few fries Snk ( PM): none D ( PM): crackers and sometimes [place], egg, cheese, wheat bread Snk ( PM): sometimes nutrigrain bar, nuts with fruit, muddie buddies Beverages: water, coffee  Usual physical activity: 35 min treadmill, 30 min machines 5 days/wk  Diet to Follow: Calories: 1600 calories Carbohydrate: 180g Protein: 120g Fat: 44g   Preferred Learning Style:   No preference indicated   Learning Readiness:   Contemplating  Ready  Change in progress     Nutritional Diagnosis:  [place]-3.3 Overweight/obesity related to past poor dietary habits and physical inactivity as evidenced by patient w/ planned sleeve gastrectomy surgery following dietary guidelines for continued weight loss.    Intervention:  Nutrition counseling for upcoming Bariatric Surgery.  Goals:  - Aim to eat breakfast in the morning even when on-the-go. Grab a meal replacement shake or yogurt or granola bar with a piece of fruit.  - Aim to eat 3 meals a day and add some snacks.    Teaching Method Utilized:  Visual Auditory Hands on  Handouts given during visit include:  none  Barriers to learning/adherence to lifestyle change: none  Demonstrated degree of understanding via:  Teach Back    Monitoring/Evaluation:  Dietary intake, exercise, and body weight in 1 month(s).

## 2017-03-08 ENCOUNTER — Ambulatory Visit: Payer: Self-pay | Admitting: Psychiatry

## 2017-03-22 ENCOUNTER — Other Ambulatory Visit: Payer: Self-pay | Admitting: Obstetrics

## 2017-03-22 DIAGNOSIS — I10 Essential (primary) hypertension: Secondary | ICD-10-CM

## 2017-03-29 ENCOUNTER — Telehealth: Payer: Self-pay

## 2017-03-29 ENCOUNTER — Other Ambulatory Visit: Payer: Self-pay | Admitting: Obstetrics

## 2017-03-29 DIAGNOSIS — I10 Essential (primary) hypertension: Secondary | ICD-10-CM

## 2017-03-29 NOTE — Telephone Encounter (Signed)
Rx sent to Pharmacy for BP med.

## 2017-03-29 NOTE — Telephone Encounter (Signed)
Returned call and patient stated that she needs a refill on her BP med. Patient stated that she scheduled her annual for 10-18 and just needs enough to make it until then, advised that I would route to provider for review.

## 2017-03-30 ENCOUNTER — Telehealth: Payer: Self-pay

## 2017-03-30 NOTE — Telephone Encounter (Signed)
Contacted pt and advised of rx sent by provider. 

## 2017-04-03 ENCOUNTER — Ambulatory Visit: Payer: Self-pay | Admitting: Registered"

## 2017-04-03 ENCOUNTER — Ambulatory Visit: Payer: Self-pay | Admitting: Skilled Nursing Facility1

## 2017-04-05 DIAGNOSIS — H1045 Other chronic allergic conjunctivitis: Secondary | ICD-10-CM | POA: Diagnosis not present

## 2017-04-09 ENCOUNTER — Other Ambulatory Visit: Payer: Self-pay | Admitting: Obstetrics

## 2017-04-09 DIAGNOSIS — I10 Essential (primary) hypertension: Secondary | ICD-10-CM

## 2017-04-11 ENCOUNTER — Telehealth: Payer: Self-pay

## 2017-04-11 NOTE — Telephone Encounter (Signed)
Returned patient's call, she wanted refill on Bp Rx, she has an appt tomorrow 04/12/17; told her we will help her to find a PCP to manage her BP when she comes in.

## 2017-04-12 ENCOUNTER — Encounter: Payer: Self-pay | Admitting: Obstetrics

## 2017-04-12 ENCOUNTER — Ambulatory Visit (INDEPENDENT_AMBULATORY_CARE_PROVIDER_SITE_OTHER): Payer: Medicare Other | Admitting: Obstetrics

## 2017-04-12 ENCOUNTER — Other Ambulatory Visit (HOSPITAL_COMMUNITY)
Admission: RE | Admit: 2017-04-12 | Discharge: 2017-04-12 | Disposition: A | Discharge status: Home / Self Care | Payer: Medicare Other | Source: Ambulatory Visit | Attending: Obstetrics | Admitting: Obstetrics

## 2017-04-12 VITALS — BP 135/86 | HR 65 | Ht 63.0 in | Wt 193.6 lb

## 2017-04-12 DIAGNOSIS — N760 Acute vaginitis: Secondary | ICD-10-CM | POA: Diagnosis not present

## 2017-04-12 DIAGNOSIS — I10 Essential (primary) hypertension: Secondary | ICD-10-CM

## 2017-04-12 DIAGNOSIS — Z01419 Encounter for gynecological examination (general) (routine) without abnormal findings: Secondary | ICD-10-CM

## 2017-04-12 DIAGNOSIS — N898 Other specified noninflammatory disorders of vagina: Secondary | ICD-10-CM

## 2017-04-12 DIAGNOSIS — B9689 Other specified bacterial agents as the cause of diseases classified elsewhere: Secondary | ICD-10-CM | POA: Insufficient documentation

## 2017-04-12 DIAGNOSIS — Z6834 Body mass index (BMI) 34.0-34.9, adult: Secondary | ICD-10-CM

## 2017-04-12 DIAGNOSIS — L732 Hidradenitis suppurativa: Secondary | ICD-10-CM

## 2017-04-12 DIAGNOSIS — E6609 Other obesity due to excess calories: Secondary | ICD-10-CM

## 2017-04-12 DIAGNOSIS — Z0000 Encounter for general adult medical examination without abnormal findings: Secondary | ICD-10-CM

## 2017-04-12 MED ORDER — CLINDAMYCIN PHOSPHATE 1 % EX SOLN: CUTANEOUS | 5 refills | Status: DC

## 2017-04-12 MED ORDER — TRIAMTERENE-HCTZ 37.5-25 MG PO CAPS: 1.0000 | ORAL_CAPSULE | Freq: Every day | ORAL | 11 refills | Status: DC

## 2017-04-12 MED ORDER — FLUCONAZOLE 150 MG PO TABS: 150.0000 mg | ORAL_TABLET | Freq: Once | ORAL | 2 refills | Status: AC

## 2017-04-12 MED ORDER — DOXYCYCLINE HYCLATE 100 MG PO CAPS: 100.0000 mg | ORAL_CAPSULE | Freq: Two times a day (BID) | ORAL | 5 refills | Status: DC

## 2017-04-13 ENCOUNTER — Encounter: Payer: Self-pay | Admitting: Obstetrics

## 2017-04-13 LAB — CERVICOVAGINAL ANCILLARY ONLY
Bacterial vaginitis: POSITIVE — AB
CHLAMYDIA, DNA PROBE: NEGATIVE
Candida vaginitis: NEGATIVE
Neisseria Gonorrhea: NEGATIVE
TRICH (WINDOWPATH): NEGATIVE

## 2017-04-13 NOTE — Progress Notes (Signed)
Subjective:        Christy Huang is a 36 y.o. female here for a routine exam.  Current complaints: None.    Personal health questionnaire:  Is patient Christy Huang, have a family history of breast and/or ovarian cancer: no Is there a family history of uterine cancer diagnosed at age < 8650, gastrointestinal cancer, urinary tract cancer, family member who is a Personnel officerLynch syndrome-associated carrier: no Is the patient overweight and hypertensive, family history of diabetes, personal history of gestational diabetes, preeclampsia or PCOS: no Is patient over 555, have PCOS,  family history of premature CHD under age 36, diabetes, smoke, have hypertension or peripheral artery disease:  no At any time, has a partner hit, kicked or otherwise hurt or frightened you?: no Over the past 2 weeks, have you felt down, depressed or hopeless?: no Over the past 2 weeks, have you felt little interest or pleasure in doing things?:no   Gynecologic History Patient's last menstrual period was 03/31/2017 (approximate). Contraception: condoms Last Pap: 2016. Results were: normal Last mammogram: n/a. Results were: n/a  Obstetric History OB History  No data available    Past Medical History:  Diagnosis Date  . Acne   . Anemia   . GERD (gastroesophageal reflux disease)   . Heart murmur    as a child   . Hypertension     Past Surgical History:  Procedure Laterality Date  . HYDRADENITIS EXCISION Right 05/05/2016   Procedure: EXCISION OF RIGHT AXILLARY HIDRADENITIS;  Surgeon: Berna Buehelsea A Connor, MD;  Location: [facility];  Service: General;  Laterality: Right;  . HYDRADENITIS EXCISION Left 10/06/2016   Procedure: EXCISION OF LEFT AXILLARY HIDRADENITIS;  Surgeon: Berna Buehelsea A Connor, MD;  Location: [facility];  Service: General;  Laterality: Left;  . Hydradentitis     Left  side 4/13     Current Outpatient Prescriptions:  .  Ginkgo Biloba 60 MG TABS, Take 1 tablet by mouth daily., Disp: , Rfl:  .  Multiple  Vitamins-Minerals (HAIR SKIN AND NAILS FORMULA) TABS, Take 2 tablets by mouth daily., Disp: , Rfl:  .  Omega-3 Fatty Acids (FISH OIL) 1000 MG CAPS, Take 1,000 mg by mouth daily. , Disp: , Rfl:  .  omeprazole (PRILOSEC) 20 MG capsule, Take 20 mg by mouth daily as needed (heartburn). , Disp: , Rfl:  .  clindamycin (CLEOCIN T) 1 % external solution, APPLY TOPICALLY 2 (TWO) TIMES DAILY., Disp: 60 mL, Rfl: 5 .  doxycycline (VIBRAMYCIN) 100 MG capsule, Take 1 capsule (100 mg total) by mouth 2 (two) times daily., Disp: 28 capsule, Rfl: 5 .  triamterene-hydrochlorothiazide (DYAZIDE) 37.5-25 MG capsule, Take 1 each (1 capsule total) by mouth daily after breakfast., Disp: 30 capsule, Rfl: 11 Allergies  Allergen Reactions  . Other Hives    Allergic to TB test per patient   . Sulfa Antibiotics Hives    Reaction "I just can't have it"    Social History  Substance Use Topics  . Smoking status: Never Smoker  . Smokeless tobacco: Never Used  . Alcohol use No    Family History  Problem Relation Age of Onset  . Diabetes Mother   . Hypertension Mother   . Diabetes Maternal Grandmother   . Hypertension Maternal Grandmother       Review of Systems  Constitutional: negative for fatigue and weight loss Respiratory: negative for cough and wheezing Cardiovascular: negative for chest pain, fatigue and palpitations Gastrointestinal: negative for abdominal pain and change in bowel  habits Musculoskeletal:negative for myalgias Neurological: negative for gait problems and tremors Behavioral/Psych: negative for abusive relationship, depression Endocrine: negative for temperature intolerance    Genitourinary:negative for abnormal menstrual periods, genital lesions, hot flashes, sexual problems and vaginal discharge Integument/breast: negative for breast lump, breast tenderness, nipple discharge and skin lesion(s)    Objective:       BP 135/86   Pulse 65   Ht 5\' 3"  (1.6 m)   Wt 193 lb 9.6 oz (87.8 kg)    LMP 03/31/2017 (Approximate)   BMI 34.29 kg/m  General:   alert  Skin:   no rash or abnormalities  Lungs:   clear to auscultation bilaterally  Heart:   regular rate and rhythm, S1, S2 normal, no murmur, click, rub or gallop  Breasts:   normal without suspicious masses, skin or nipple changes or axillary nodes  Abdomen:  normal findings: no organomegaly, soft, non-tender and no hernia  Pelvis:  External genitalia: normal general appearance Urinary system: urethral meatus normal and bladder without fullness, nontender Vaginal: normal without tenderness, induration or masses Cervix: normal appearance Adnexa: normal bimanual exam Uterus: anteverted and non-tender, normal size   Lab Review Urine pregnancy test Labs reviewed yes Radiologic studies reviewed no  50% of 20 min visit spent on counseling and coordination of care.    Assessment:     1. Encounter for routine gynecological examination with Papanicolaou smear of cervix Rx: - Cytology - PAP  2. Hidradenitis axillaris Rx: - clindamycin (CLEOCIN T) 1 % external solution; APPLY TOPICALLY 2 (TWO) TIMES DAILY.  Dispense: 60 mL; Refill: 5 - doxycycline (VIBRAMYCIN) 100 MG capsule; Take 1 capsule (100 mg total) by mouth 2 (two) times daily.  Dispense: 28 capsule; Refill: 5  3. Class 1 obesity due to excess calories without serious comorbidity with body mass index (BMI) of 34.0 to 34.9 in adult Rx:  4. Essential hypertension Rx: - triamterene-hydrochlorothiazide (DYAZIDE) 37.5-25 MG capsule; Take 1 each (1 capsule total) by mouth daily after breakfast.  Dispense: 30 capsule; Refill: 11  5. Vaginal discharge Rx: - Cervicovaginal ancillary only - fluconazole (DIFLUCAN) 150 MG tablet; Take 1 tablet (150 mg total) by mouth once.  Dispense: 1 tablet; Refill: 2   Plan:    Education reviewed: calcium supplements, depression evaluation, low fat, low cholesterol diet, safe sex/STD prevention, self breast exams and weight bearing  exercise. Contraception: condoms. Follow up in: 1 year.   Meds ordered this encounter  Medications  . clindamycin (CLEOCIN T) 1 % external solution    Sig: APPLY TOPICALLY 2 (TWO) TIMES DAILY.    Dispense:  60 mL    Refill:  5  . triamterene-hydrochlorothiazide (DYAZIDE) 37.5-25 MG capsule    Sig: Take 1 each (1 capsule total) by mouth daily after breakfast.    Dispense:  30 capsule    Refill:  11  . doxycycline (VIBRAMYCIN) 100 MG capsule    Sig: Take 1 capsule (100 mg total) by mouth 2 (two) times daily.    Dispense:  28 capsule    Refill:  5  . fluconazole (DIFLUCAN) 150 MG tablet    Sig: Take 1 tablet (150 mg total) by mouth once.    Dispense:  1 tablet    Refill:  2   No orders of the defined types were placed in this encounter.

## 2017-04-14 ENCOUNTER — Other Ambulatory Visit: Payer: Self-pay | Admitting: Obstetrics

## 2017-04-14 DIAGNOSIS — N760 Acute vaginitis: Principal | ICD-10-CM

## 2017-04-14 DIAGNOSIS — B9689 Other specified bacterial agents as the cause of diseases classified elsewhere: Secondary | ICD-10-CM

## 2017-04-14 MED ORDER — METRONIDAZOLE 500 MG PO TABS: 500.0000 mg | ORAL_TABLET | Freq: Two times a day (BID) | ORAL | 2 refills | Status: DC

## 2017-04-16 ENCOUNTER — Other Ambulatory Visit: Payer: Self-pay

## 2017-04-17 ENCOUNTER — Other Ambulatory Visit: Payer: Medicare Other

## 2017-04-17 DIAGNOSIS — Z113 Encounter for screening for infections with a predominantly sexual mode of transmission: Secondary | ICD-10-CM | POA: Diagnosis not present

## 2017-04-17 LAB — CYTOLOGY - PAP
Diagnosis: NEGATIVE
HPV (WINDOPATH): NOT DETECTED

## 2017-04-17 NOTE — Addendum Note (Signed)
Addended by: Lear NgMARTIN, Amberrose Friebel L on: 04/17/2017 03:25 PM   Modules accepted: Orders

## 2017-04-18 LAB — HEPATITIS B SURFACE ANTIGEN: HEP B S AG: NEGATIVE

## 2017-04-18 LAB — HIV ANTIBODY (ROUTINE TESTING W REFLEX): HIV Screen 4th Generation wRfx: NONREACTIVE

## 2017-04-18 LAB — HEPATITIS C ANTIBODY: Hep C Virus Ab: <0.1 s/co ratio (ref 0.0–0.9)

## 2017-04-18 LAB — RPR: RPR Ser Ql: NONREACTIVE

## 2017-08-28 ENCOUNTER — Ambulatory Visit: Payer: Self-pay | Admitting: Obstetrics

## 2017-08-29 ENCOUNTER — Ambulatory Visit: Payer: Self-pay | Admitting: Obstetrics

## 2017-10-05 ENCOUNTER — Other Ambulatory Visit: Payer: Self-pay | Admitting: Obstetrics

## 2018-03-19 ENCOUNTER — Other Ambulatory Visit: Payer: Self-pay

## 2018-03-19 DIAGNOSIS — L732 Hidradenitis suppurativa: Secondary | ICD-10-CM

## 2018-03-19 NOTE — Telephone Encounter (Signed)
Pt requests rf for Doxycycline to treat her Hidradenitis. She has an appt scheduled 04/13/18. Pt aware we will contact her once approved/denied.

## 2018-03-20 MED ORDER — DOXYCYCLINE HYCLATE 100 MG PO CAPS: 100.0000 mg | ORAL_CAPSULE | Freq: Two times a day (BID) | ORAL | 5 refills | Status: DC

## 2018-03-20 NOTE — Telephone Encounter (Signed)
Pt aware rx is at the pharmacy.

## 2018-04-16 ENCOUNTER — Encounter: Payer: Self-pay | Admitting: Obstetrics

## 2018-04-16 ENCOUNTER — Ambulatory Visit (INDEPENDENT_AMBULATORY_CARE_PROVIDER_SITE_OTHER): Payer: Medicare Other | Admitting: Obstetrics

## 2018-04-16 ENCOUNTER — Other Ambulatory Visit (HOSPITAL_COMMUNITY)
Admission: RE | Admit: 2018-04-16 | Discharge: 2018-04-16 | Disposition: A | Discharge status: Home / Self Care | Payer: Medicare Other | Source: Ambulatory Visit | Attending: Obstetrics | Admitting: Obstetrics

## 2018-04-16 VITALS — BP 116/82 | HR 67 | Resp 16 | Ht 62.0 in | Wt 188.5 lb

## 2018-04-16 DIAGNOSIS — Z01419 Encounter for gynecological examination (general) (routine) without abnormal findings: Secondary | ICD-10-CM | POA: Diagnosis not present

## 2018-04-16 DIAGNOSIS — Z113 Encounter for screening for infections with a predominantly sexual mode of transmission: Secondary | ICD-10-CM

## 2018-04-16 DIAGNOSIS — N898 Other specified noninflammatory disorders of vagina: Secondary | ICD-10-CM

## 2018-04-16 DIAGNOSIS — Z23 Encounter for immunization: Secondary | ICD-10-CM

## 2018-04-16 DIAGNOSIS — I10 Essential (primary) hypertension: Secondary | ICD-10-CM

## 2018-04-16 DIAGNOSIS — B3731 Acute candidiasis of vulva and vagina: Secondary | ICD-10-CM

## 2018-04-16 DIAGNOSIS — K2100 Gastro-esophageal reflux disease with esophagitis, without bleeding: Secondary | ICD-10-CM

## 2018-04-16 DIAGNOSIS — B373 Candidiasis of vulva and vagina: Secondary | ICD-10-CM

## 2018-04-16 DIAGNOSIS — L732 Hidradenitis suppurativa: Secondary | ICD-10-CM

## 2018-04-16 MED ORDER — CLINDAMYCIN PHOSPHATE 1 % EX SOLN: CUTANEOUS | 5 refills | Status: DC

## 2018-04-16 MED ORDER — FLUCONAZOLE 150 MG PO TABS: 150.0000 mg | ORAL_TABLET | Freq: Once | ORAL | 2 refills | Status: AC

## 2018-04-16 MED ORDER — DOXYCYCLINE HYCLATE 100 MG PO CAPS: 100.0000 mg | ORAL_CAPSULE | Freq: Two times a day (BID) | ORAL | 5 refills | Status: DC

## 2018-04-16 MED ORDER — TRIAMTERENE-HCTZ 37.5-25 MG PO CAPS: 1.0000 | ORAL_CAPSULE | Freq: Every day | ORAL | 11 refills | Status: DC

## 2018-04-16 MED ORDER — OMEPRAZOLE 20 MG PO CPDR: 20.0000 mg | DELAYED_RELEASE_CAPSULE | Freq: Two times a day (BID) | ORAL | 11 refills | Status: DC

## 2018-04-16 NOTE — Patient Instructions (Addendum)
Tdap Vaccine (Tetanus, Diphtheria and Pertussis): What You Need to Know 1. Why get vaccinated? Tetanus, diphtheria and pertussis are very serious diseases. Tdap vaccine can protect us from these diseases. And, Tdap vaccine given to pregnant women can protect newborn babies against pertussis. TETANUS (Lockjaw) is rare in the [place] today. It causes painful muscle tightening and stiffness, usually all over the body.  It can lead to tightening of muscles in the head and neck so you can't open your mouth, swallow, or sometimes even breathe. Tetanus kills about 1 out of 10 people who are infected even after receiving the best medical care.  DIPHTHERIA is also rare in the [place] today. It can cause a thick coating to form in the back of the throat.  It can lead to breathing problems, heart failure, paralysis, and death.  PERTUSSIS (Whooping Cough) causes severe coughing spells, which can cause difficulty breathing, vomiting and disturbed sleep.  It can also lead to weight loss, incontinence, and rib fractures. Up to 2 in 100 adolescents and 5 in 100 adults with pertussis are hospitalized or have complications, which could include pneumonia or death.  These diseases are caused by bacteria. Diphtheria and pertussis are spread from person to person through secretions from coughing or sneezing. Tetanus enters the body through cuts, scratches, or wounds. Before vaccines, as many as 200,000 cases of diphtheria, 200,000 cases of pertussis, and hundreds of cases of tetanus, were reported in the [place] each year. Since vaccination began, reports of cases for tetanus and diphtheria have dropped by about 99% and for pertussis by about 80%. 2. Tdap vaccine Tdap vaccine can protect adolescents and adults from tetanus, diphtheria, and pertussis. One dose of Tdap is routinely given at age 11 or 12. People who did not get Tdap at that age should get it as soon as possible. Tdap is especially  important for healthcare professionals and anyone having close contact with a baby younger than 12 months. Pregnant women should get a dose of Tdap during every pregnancy, to protect the newborn from pertussis. Infants are most at risk for severe, life-threatening complications from pertussis. Another vaccine, called Td, protects against tetanus and diphtheria, but not pertussis. A Td booster should be given every 10 years. Tdap may be given as one of these boosters if you have never gotten Tdap before. Tdap may also be given after a severe cut or burn to prevent tetanus infection. Your doctor or the person giving you the vaccine can give you more information. Tdap may safely be given at the same time as other vaccines. 3. Some people should not get this vaccine  A person who has ever had a life-threatening allergic reaction after a previous dose of any diphtheria, tetanus or pertussis containing vaccine, OR has a severe allergy to any part of this vaccine, should not get Tdap vaccine. Tell the person giving the vaccine about any severe allergies.  Anyone who had coma or long repeated seizures within 7 days after a childhood dose of DTP or DTaP, or a previous dose of Tdap, should not get Tdap, unless a cause other than the vaccine was found. They can still get Td.  Talk to your doctor if you: ? have seizures or another nervous system problem, ? had severe pain or swelling after any vaccine containing diphtheria, tetanus or pertussis, ? ever had a condition called Guillain-Barr Syndrome (GBS), ? aren't feeling well on the day the shot is scheduled. 4. Risks With any medicine, including   vaccines, there is a chance of side effects. These are usually mild and go away on their own. Serious reactions are also possible but are rare. Most people who get Tdap vaccine do not have any problems with it. Mild problems following Tdap: (Did not interfere with activities)  Pain where the shot was given (about  3 in 4 adolescents or 2 in 3 adults)  Redness or swelling where the shot was given (about 1 person in 5)  Mild fever of at least 100.4F (up to about 1 in 25 adolescents or 1 in 100 adults)  Headache (about 3 or 4 people in 10)  Tiredness (about 1 person in 3 or 4)  Nausea, vomiting, diarrhea, stomach ache (up to 1 in 4 adolescents or 1 in 10 adults)  Chills, sore joints (about 1 person in 10)  Body aches (about 1 person in 3 or 4)  Rash, swollen glands (uncommon)  Moderate problems following Tdap: (Interfered with activities, but did not require medical attention)  Pain where the shot was given (up to 1 in 5 or 6)  Redness or swelling where the shot was given (up to about 1 in 16 adolescents or 1 in 12 adults)  Fever over 102F (about 1 in 100 adolescents or 1 in 250 adults)  Headache (about 1 in 7 adolescents or 1 in 10 adults)  Nausea, vomiting, diarrhea, stomach ache (up to 1 or 3 people in 100)  Swelling of the entire arm where the shot was given (up to about 1 in 500).  Severe problems following Tdap: (Unable to perform usual activities; required medical attention)  Swelling, severe pain, bleeding and redness in the arm where the shot was given (rare).  Problems that could happen after any vaccine:  People sometimes faint after a medical procedure, including vaccination. Sitting or lying down for about 15 minutes can help prevent fainting, and injuries caused by a fall. Tell your doctor if you feel dizzy, or have vision changes or ringing in the ears.  Some people get severe pain in the shoulder and have difficulty moving the arm where a shot was given. This happens very rarely.  Any medication can cause a severe allergic reaction. Such reactions from a vaccine are very rare, estimated at fewer than 1 in a million doses, and would happen within a few minutes to a few hours after the vaccination. As with any medicine, there is a very remote chance of a vaccine  causing a serious injury or death. The safety of vaccines is always being monitored. For more information, visit: www.cdc.gov/vaccinesafety/ 5. What if there is a serious problem? What should I look for? Look for anything that concerns you, such as signs of a severe allergic reaction, very high fever, or unusual behavior. Signs of a severe allergic reaction can include hives, swelling of the face and throat, difficulty breathing, a fast heartbeat, dizziness, and weakness. These would usually start a few minutes to a few hours after the vaccination. What should I do?  If you think it is a severe allergic reaction or other emergency that can't wait, call 9-1-1 or get the person to the nearest hospital. Otherwise, call your doctor.  Afterward, the reaction should be reported to the Vaccine Adverse Event Reporting System (VAERS). Your doctor might file this report, or you can do it yourself through the VAERS web site at www.vaers.hhs.gov, or by calling 1-800-822-7967. ? VAERS does not give medical advice. 6. The National Vaccine Injury Compensation Program The National   Vaccine Injury Compensation Program (VICP) is a federal program that was created to compensate people who may have been injured by certain vaccines. Persons who believe they may have been injured by a vaccine can learn about the program and about filing a claim by calling 1-800-338-2382 or visiting the VICP website at www.hrsa.gov/vaccinecompensation. There is a time limit to file a claim for compensation. 7. How can I learn more?  Ask your doctor. He or she can give you the vaccine package insert or suggest other sources of information.  Call your local or state health department.  Contact the Centers for Disease Control and Prevention (CDC): ? Call 1-800-232-4636 (1-800-CDC-INFO) or ? Visit CDC's website at www.cdc.gov/vaccines CDC Tdap Vaccine VIS (08/19/13) This information is not intended to replace advice given to you by your  health care provider. Make sure you discuss any questions you have with your health care provider. Document Released: 12/12/2011 Document Revised: 03/02/2016 Document Reviewed: 03/02/2016 Elsevier Interactive Patient Education  2017 Elsevier Inc.  

## 2018-04-16 NOTE — Progress Notes (Signed)
Subjective:        Christy Huang is a 37 y.o. female here for a routine exam.  Current complaints: Malodorous vaginal discharge.    Personal health questionnaire:  Is patient [name], have a family history of breast and/or ovarian cancer: no Is there a family history of uterine cancer diagnosed at age < 92, gastrointestinal cancer, urinary tract cancer, family member who is a Personnel officer syndrome-associated carrier: no Is the patient overweight and hypertensive, family history of diabetes, personal history of gestational diabetes, preeclampsia or PCOS: no Is patient over 32, have PCOS,  family history of premature CHD under age 73, diabetes, smoke, have hypertension or peripheral artery disease:  no At any time, has a partner hit, kicked or otherwise hurt or frightened you?: no Over the past 2 weeks, have you felt down, depressed or hopeless?: no Over the past 2 weeks, have you felt little interest or pleasure in doing things?:no   Gynecologic History Patient's last menstrual period was 03/28/2018 (approximate). Contraception: condoms Last Pap: 2018. Results were: normal Last mammogram: n/a. Results were: n/a  Obstetric History OB History  No data available    Past Medical History:  Diagnosis Date  . Acne   . Anemia   . GERD (gastroesophageal reflux disease)   . Heart murmur    as a child   . Hypertension     Past Surgical History:  Procedure Laterality Date  . HYDRADENITIS EXCISION Right 05/05/2016   Procedure: EXCISION OF RIGHT AXILLARY HIDRADENITIS;  Surgeon: Berna Bue, MD;  Location: [facility];  Service: General;  Laterality: Right;  . HYDRADENITIS EXCISION Left 10/06/2016   Procedure: EXCISION OF LEFT AXILLARY HIDRADENITIS;  Surgeon: Berna Bue, MD;  Location: [facility];  Service: General;  Laterality: Left;  . Hydradentitis     Left  side 4/13     Current Outpatient Medications:  .  clindamycin (CLEOCIN T) 1 % external solution, APPLY TOPICALLY 2  (TWO) TIMES DAILY., Disp: 60 mL, Rfl: 5 .  doxycycline (VIBRAMYCIN) 100 MG capsule, Take 1 capsule (100 mg total) by mouth 2 (two) times daily., Disp: 28 capsule, Rfl: 5 .  omeprazole (PRILOSEC) 20 MG capsule, Take 1 capsule (20 mg total) by mouth 2 (two) times daily before a meal., Disp: 60 capsule, Rfl: 11 .  triamterene-hydrochlorothiazide (DYAZIDE) 37.5-25 MG capsule, Take 1 each (1 capsule total) by mouth daily after breakfast., Disp: 30 capsule, Rfl: 11 .  fluconazole (DIFLUCAN) 150 MG tablet, Take 1 tablet (150 mg total) by mouth once for 1 dose., Disp: 1 tablet, Rfl: 2 .  Ginkgo Biloba 60 MG TABS, Take 1 tablet by mouth daily., Disp: , Rfl:  .  Multiple Vitamins-Minerals (HAIR SKIN AND NAILS FORMULA) TABS, Take 2 tablets by mouth daily., Disp: , Rfl:  .  Omega-3 Fatty Acids (FISH OIL) 1000 MG CAPS, Take 1,000 mg by mouth daily. , Disp: , Rfl:  Allergies  Allergen Reactions  . Other Hives    Allergic to TB test per patient   . Sulfa Antibiotics Hives    Reaction "I just can't have it"    Social History   Tobacco Use  . Smoking status: Never Smoker  . Smokeless tobacco: Never Used  Substance Use Topics  . Alcohol use: No    Family History  Problem Relation Age of Onset  . Diabetes Mother   . Hypertension Mother   . Diabetes Maternal Grandmother   . Hypertension Maternal Grandmother  Review of Systems  Constitutional: negative for fatigue and weight loss Respiratory: negative for cough and wheezing Cardiovascular: negative for chest pain, fatigue and palpitations Gastrointestinal: negative for abdominal pain and change in bowel habits Musculoskeletal:negative for myalgias Neurological: negative for gait problems and tremors Behavioral/Psych: negative for abusive relationship, depression Endocrine: negative for temperature intolerance    Genitourinary:negative for abnormal menstrual periods, genital lesions, hot flashes, sexual problems.  Positive for vaginal  discharge Integument/breast: negative for breast lump, breast tenderness, nipple discharge and skin lesion(s)    Objective:       BP 116/82 (BP Location: Right Arm, Patient Position: Sitting, Cuff Size: Normal)   Pulse 67   Resp 16   Ht 5\' 2"  (1.575 m)   Wt 188 lb 8 oz (85.5 kg)   LMP 03/28/2018 (Approximate)   BMI 34.48 kg/m  General:   alert  Skin:   no rash or abnormalities  Lungs:   clear to auscultation bilaterally  Heart:   regular rate and rhythm, S1, S2 normal, no murmur, click, rub or gallop  Breasts:   normal without suspicious masses, skin or nipple changes or axillary nodes  Abdomen:  normal findings: no organomegaly, soft, non-tender and no hernia  Pelvis:  External genitalia: normal general appearance Urinary system: urethral meatus normal and bladder without fullness, nontender Vaginal: normal without tenderness, induration or masses Cervix: normal appearance Adnexa: normal bimanual exam Uterus: anteverted and non-tender, normal size   Lab Review Urine pregnancy test Labs reviewed yes Radiologic studies reviewed no  50% of 20 min visit spent on counseling and coordination of care.   Assessment:     1. Encounter for routine gynecological examination with Papanicolaou smear of cervix Rx: - Cytology - PAP( [place])  2. Vaginal discharge Rx: - Cervicovaginal ancillary only( [place])  3. Candida vaginitis Rx: - fluconazole (DIFLUCAN) 150 MG tablet; Take 1 tablet (150 mg total) by mouth once for 1 dose.  Dispense: 1 tablet; Refill: 2  4. Screening for STD (sexually transmitted disease) Rx: - HepB+HepC+HIV Panel - RPR - HIV antibody (with reflex)  5. Essential hypertension Rx: - triamterene-hydrochlorothiazide (DYAZIDE) 37.5-25 MG capsule; Take 1 each (1 capsule total) by mouth daily after breakfast.  Dispense: 30 capsule; Refill: 11  6. GERD with esophagitis Rx: - omeprazole (PRILOSEC) 20 MG capsule; Take 1 capsule (20 mg total) by mouth 2  (two) times daily before a meal.  Dispense: 60 capsule; Refill: 11  7. Hidradenitis axillaris Rx: - clindamycin (CLEOCIN T) 1 % external solution; APPLY TOPICALLY 2 (TWO) TIMES DAILY.  Dispense: 60 mL; Refill: 5 - doxycycline (VIBRAMYCIN) 100 MG capsule; Take 1 capsule (100 mg total) by mouth 2 (two) times daily.  Dispense: 28 capsule; Refill: 5  8. Need for tetanus, diphtheria, and acellular pertussis (Tdap) vaccine in patient of adolescent age or older Rx: - Tdap vaccine greater than or equal to 7yo IM    Plan:    Education reviewed: calcium supplements, depression evaluation, low fat, low cholesterol diet, safe sex/STD prevention, self breast exams and weight bearing exercise. Contraception: condoms. Follow up in: 1 year.   Meds ordered this encounter  Medications  . triamterene-hydrochlorothiazide (DYAZIDE) 37.5-25 MG capsule    Sig: Take 1 each (1 capsule total) by mouth daily after breakfast.    Dispense:  30 capsule    Refill:  11  . omeprazole (PRILOSEC) 20 MG capsule    Sig: Take 1 capsule (20 mg total) by mouth 2 (two) times daily before  a meal.    Dispense:  60 capsule    Refill:  11  . clindamycin (CLEOCIN T) 1 % external solution    Sig: APPLY TOPICALLY 2 (TWO) TIMES DAILY.    Dispense:  60 mL    Refill:  5  . doxycycline (VIBRAMYCIN) 100 MG capsule    Sig: Take 1 capsule (100 mg total) by mouth 2 (two) times daily.    Dispense:  28 capsule    Refill:  5  . fluconazole (DIFLUCAN) 150 MG tablet    Sig: Take 1 tablet (150 mg total) by mouth once for 1 dose.    Dispense:  1 tablet    Refill:  2   Orders Placed This Encounter  Procedures  . Tdap vaccine greater than or equal to 7yo IM  . HepB+HepC+HIV Panel  . RPR  . HIV antibody (with reflex)    Brock Bad MD 04-16-2018

## 2018-04-17 LAB — CERVICOVAGINAL ANCILLARY ONLY
BACTERIAL VAGINITIS: NEGATIVE
Candida vaginitis: POSITIVE — AB
Chlamydia: NEGATIVE
NEISSERIA GONORRHEA: NEGATIVE
Trichomonas: NEGATIVE

## 2018-04-18 LAB — HEPB+HEPC+HIV PANEL
HEP B SURFACE AB, QUAL: NONREACTIVE
HIV SCREEN 4TH GENERATION: NONREACTIVE
Hep B C IgM: NEGATIVE
Hep B Core Total Ab: NEGATIVE
Hep B E Ab: NEGATIVE
Hep B E Ag: NEGATIVE
Hepatitis B Surface Ag: NEGATIVE

## 2018-04-18 LAB — RPR: RPR: NONREACTIVE

## 2018-04-19 ENCOUNTER — Other Ambulatory Visit: Payer: Self-pay | Admitting: Obstetrics

## 2018-04-19 DIAGNOSIS — B373 Candidiasis of vulva and vagina: Secondary | ICD-10-CM

## 2018-04-19 DIAGNOSIS — B3731 Acute candidiasis of vulva and vagina: Secondary | ICD-10-CM

## 2018-04-19 LAB — CYTOLOGY - PAP
CHLAMYDIA, DNA PROBE: NEGATIVE
Diagnosis: NEGATIVE
HPV (WINDOPATH): NOT DETECTED
NEISSERIA GONORRHEA: NEGATIVE

## 2018-04-19 MED ORDER — FLUCONAZOLE 150 MG PO TABS: 150.0000 mg | ORAL_TABLET | Freq: Once | ORAL | 0 refills | Status: AC

## 2018-06-09 ENCOUNTER — Other Ambulatory Visit: Payer: Self-pay | Admitting: Obstetrics

## 2018-06-09 DIAGNOSIS — I10 Essential (primary) hypertension: Secondary | ICD-10-CM

## 2018-08-24 ENCOUNTER — Other Ambulatory Visit: Payer: Self-pay | Admitting: Obstetrics

## 2018-08-24 DIAGNOSIS — B9689 Other specified bacterial agents as the cause of diseases classified elsewhere: Secondary | ICD-10-CM

## 2018-08-24 DIAGNOSIS — N760 Acute vaginitis: Principal | ICD-10-CM

## 2019-03-22 ENCOUNTER — Other Ambulatory Visit: Payer: Self-pay | Admitting: Obstetrics

## 2019-03-22 DIAGNOSIS — K2100 Gastro-esophageal reflux disease with esophagitis, without bleeding: Secondary | ICD-10-CM

## 2019-05-28 ENCOUNTER — Ambulatory Visit (INDEPENDENT_AMBULATORY_CARE_PROVIDER_SITE_OTHER): Payer: Medicare Other | Admitting: Obstetrics

## 2019-05-28 ENCOUNTER — Encounter: Payer: Self-pay | Admitting: Obstetrics

## 2019-05-28 ENCOUNTER — Other Ambulatory Visit (HOSPITAL_COMMUNITY)
Admission: RE | Admit: 2019-05-28 | Discharge: 2019-05-28 | Disposition: A | Discharge status: Home / Self Care | Payer: Medicare Other | Source: Ambulatory Visit | Attending: Obstetrics | Admitting: Obstetrics

## 2019-05-28 ENCOUNTER — Other Ambulatory Visit: Payer: Self-pay

## 2019-05-28 VITALS — BP 139/92 | HR 75 | Wt 207.0 lb

## 2019-05-28 DIAGNOSIS — R87610 Atypical squamous cells of undetermined significance on cytologic smear of cervix (ASC-US): Secondary | ICD-10-CM | POA: Insufficient documentation

## 2019-05-28 DIAGNOSIS — Z01411 Encounter for gynecological examination (general) (routine) with abnormal findings: Secondary | ICD-10-CM | POA: Diagnosis not present

## 2019-05-28 DIAGNOSIS — B373 Candidiasis of vulva and vagina: Secondary | ICD-10-CM | POA: Diagnosis not present

## 2019-05-28 DIAGNOSIS — Z113 Encounter for screening for infections with a predominantly sexual mode of transmission: Secondary | ICD-10-CM | POA: Diagnosis not present

## 2019-05-28 DIAGNOSIS — Z1151 Encounter for screening for human papillomavirus (HPV): Secondary | ICD-10-CM | POA: Diagnosis not present

## 2019-05-28 DIAGNOSIS — Z01419 Encounter for gynecological examination (general) (routine) without abnormal findings: Secondary | ICD-10-CM | POA: Diagnosis not present

## 2019-05-28 DIAGNOSIS — L732 Hidradenitis suppurativa: Secondary | ICD-10-CM

## 2019-05-28 DIAGNOSIS — E669 Obesity, unspecified: Secondary | ICD-10-CM

## 2019-05-28 DIAGNOSIS — N898 Other specified noninflammatory disorders of vagina: Secondary | ICD-10-CM

## 2019-05-28 DIAGNOSIS — I10 Essential (primary) hypertension: Secondary | ICD-10-CM

## 2019-05-28 DIAGNOSIS — L0292 Furuncle, unspecified: Secondary | ICD-10-CM

## 2019-05-28 MED ORDER — TRIAMTERENE-HCTZ 37.5-25 MG PO CAPS: 1.0000 | ORAL_CAPSULE | Freq: Every day | ORAL | 3 refills | Status: DC

## 2019-05-28 MED ORDER — DOXYCYCLINE HYCLATE 100 MG PO CAPS: 100.0000 mg | ORAL_CAPSULE | Freq: Two times a day (BID) | ORAL | 5 refills | Status: DC

## 2019-05-28 MED ORDER — CLINDAMYCIN PHOSPHATE 1 % EX SOLN: CUTANEOUS | 5 refills | Status: DC

## 2019-05-28 NOTE — Progress Notes (Signed)
Subjective:        Christy Huang is a 38 y.o. female here for a routine exam.  Current complaints: Recurrent boils..    Personal health questionnaire:  Is patient [name], have a family history of breast and/or ovarian cancer: no Is there a family history of uterine cancer diagnosed at age < 38, gastrointestinal cancer, urinary tract cancer, family member who is a Personnel officer syndrome-associated carrier: yes Is the patient overweight and hypertensive, family history of diabetes, personal history of gestational diabetes, preeclampsia or PCOS: yes Is patient over 70, have PCOS,  family history of premature CHD under age 23, diabetes, smoke, have hypertension or peripheral artery disease:  no At any time, has a partner hit, kicked or otherwise hurt or frightened you?: no Over the past 2 weeks, have you felt down, depressed or hopeless?: no Over the past 2 weeks, have you felt little interest or pleasure in doing things?:no   Gynecologic History Patient's last menstrual period was 05/23/2019 (approximate). Contraception: none Last Pap: 04-16-2018. Results were: normal Last mammogram: n/a. Results were: n/a  Obstetric History OB History  Gravida Para Term Preterm AB Living  3         3  SAB TAB Ectopic Multiple Live Births               # Outcome Date GA Lbr Len/2nd Weight Sex Delivery Anes PTL Lv  3 Gravida           2 Gravida           1 [place]             Past Medical History:  Diagnosis Date  . Acne   . Anemia   . GERD (gastroesophageal reflux disease)   . Heart murmur    as a child   . Hypertension     Past Surgical History:  Procedure Laterality Date  . HYDRADENITIS EXCISION Right 05/05/2016   Procedure: EXCISION OF RIGHT AXILLARY HIDRADENITIS;  Surgeon: Berna Bue, MD;  Location: [facility];  Service: General;  Laterality: Right;  . HYDRADENITIS EXCISION Left 10/06/2016   Procedure: EXCISION OF LEFT AXILLARY HIDRADENITIS;  Surgeon: Berna Bue, MD;   Location: [facility];  Service: General;  Laterality: Left;  . Hydradentitis     Left  side 4/13     Current Outpatient Medications:  .  Bioflavonoid Products (VITAMIN C) CHEW, Chew 1 tablet by mouth., Disp: , Rfl:  .  doxycycline (VIBRAMYCIN) 100 MG capsule, Take 1 capsule (100 mg total) by mouth 2 (two) times daily., Disp: 60 capsule, Rfl: 5 .  Multiple Vitamins-Minerals (HAIR SKIN AND NAILS FORMULA) TABS, Take 2 tablets by mouth daily., Disp: , Rfl:  .  Omega-3 Fatty Acids (FISH OIL) 1000 MG CAPS, Take 1,000 mg by mouth daily. , Disp: , Rfl:  .  omeprazole (PRILOSEC) 20 MG capsule, TAKE 1 CAPSULE (20 MG TOTAL) BY MOUTH 2 (TWO) TIMES DAILY BEFORE A MEAL., Disp: 60 capsule, Rfl: 5 .  triamterene-hydrochlorothiazide (DYAZIDE) 37.5-25 MG capsule, Take 1 each (1 capsule total) by mouth daily after breakfast., Disp: 90 capsule, Rfl: 3 .  clindamycin (CLEOCIN T) 1 % external solution, APPLY TOPICALLY 2 (TWO) TIMES DAILY., Disp: 60 mL, Rfl: 5 .  Ginkgo Biloba 60 MG TABS, Take 1 tablet by mouth daily., Disp: , Rfl:  .  metroNIDAZOLE (FLAGYL) 500 MG tablet, TAKE 1 TABLET BY MOUTH TWICE A DAY (Patient not taking: Reported on 05/28/2019), Disp: 14 tablet,  Rfl: 2 Allergies  Allergen Reactions  . Other Hives    Allergic to TB test per patient   . Sulfa Antibiotics Hives    Reaction "I just can't have it"    Social History   Tobacco Use  . Smoking status: Never Smoker  . Smokeless tobacco: Never Used  Substance Use Topics  . Alcohol use: No    Family History  Problem Relation Age of Onset  . Diabetes Mother   . Hypertension Mother   . Diabetes Maternal Grandmother   . Hypertension Maternal Grandmother       Review of Systems  Constitutional: negative for fatigue and weight loss Respiratory: negative for cough and wheezing Cardiovascular: negative for chest pain, fatigue and palpitations Gastrointestinal: negative for abdominal pain and change in bowel habits Musculoskeletal:negative for  myalgias Neurological: negative for gait problems and tremors Behavioral/Psych: negative for abusive relationship, depression Endocrine: negative for temperature intolerance    Genitourinary:negative for abnormal menstrual periods, genital lesions, hot flashes, sexual problems and vaginal discharge Integument/breast: negative for breast lump, breast tenderness, nipple discharge and skin lesion(s)    Objective:       BP (!) 139/92   Pulse 75   Wt 207 lb (93.9 kg)   LMP 05/23/2019 (Approximate)   BMI 37.86 kg/m  General:   alert  Skin:   no rash or abnormalities  Lungs:   clear to auscultation bilaterally  Heart:   regular rate and rhythm, S1, S2 normal, no murmur, click, rub or gallop  Breasts:   normal without suspicious masses, skin or nipple changes or axillary nodes  Abdomen:  normal findings: no organomegaly, soft, non-tender and no hernia  Pelvis:  External genitalia: normal general appearance Urinary system: urethral meatus normal and bladder without fullness, nontender Vaginal: normal without tenderness, induration or masses Cervix: normal appearance Adnexa: normal bimanual exam Uterus: anteverted and non-tender, normal size   Lab Review Urine pregnancy test Labs reviewed yes Radiologic studies reviewed no  50% of 25 min visit spent on counseling and coordination of care.   Assessment:     1. Encounter for routine gynecological examination with Papanicolaou smear of cervix Rx: - Cytology - PAP( [place])  2. Vaginal discharge Rx: - Cervicovaginal ancillary only( [place])  3. Screening for STD (sexually transmitted disease) Rx: - Hepatitis B surface antigen - Hepatitis C antibody - HIV antibody - RPR  4. Hidradenitis axillaris Rx: - doxycycline (VIBRAMYCIN) 100 MG capsule; Take 1 capsule (100 mg total) by mouth 2 (two) times daily.  Dispense: 60 capsule; Refill: 5  5. Boils of multiple sites Rx: - clindamycin (CLEOCIN T) 1 % external solution;  APPLY TOPICALLY 2 (TWO) TIMES DAILY.  Dispense: 60 mL; Refill: 5 - Ambulatory referral to Dermatology  6. Essential hypertension Rx: - triamterene-hydrochlorothiazide (DYAZIDE) 37.5-25 MG capsule; Take 1 each (1 capsule total) by mouth daily after breakfast.  Dispense: 90 capsule; Refill: 3  7. Obesity (BMI 35.0-39.9 without comorbidity) - program of caloric restriction, exercise and behavioral modification recommended for weight management    Plan:    Education reviewed: calcium supplements, depression evaluation, low fat, low cholesterol diet, safe sex/STD prevention, self breast exams and weight bearing exercise. Contraception: none. Follow up in: 1 year.   Meds ordered this encounter  Medications  . clindamycin (CLEOCIN T) 1 % external solution    Sig: APPLY TOPICALLY 2 (TWO) TIMES DAILY.    Dispense:  60 mL    Refill:  5  . triamterene-hydrochlorothiazide (DYAZIDE)  37.5-25 MG capsule    Sig: Take 1 each (1 capsule total) by mouth daily after breakfast.    Dispense:  90 capsule    Refill:  3  . doxycycline (VIBRAMYCIN) 100 MG capsule    Sig: Take 1 capsule (100 mg total) by mouth 2 (two) times daily.    Dispense:  60 capsule    Refill:  5   Orders Placed This Encounter  Procedures  . Hepatitis B surface antigen  . Hepatitis C antibody  . HIV antibody  . RPR  . Ambulatory referral to Dermatology    Referral Priority:   Routine    Referral Type:   Consultation    Referral Reason:   Specialty Services Required    Requested Specialty:   Dermatology    Number of Visits Requested:   1    Brock BadHarper, Kanyla Omeara A, MD 05/28/2019 3:26 PM

## 2019-05-28 NOTE — Progress Notes (Signed)
Patient Present for Annual Exam Today.  LMP:05/23/19 Contraception:None  STD SCREENING: Full Panel  Family Hx of Breast Cancer:None  Last pap:04/16/2018 WNL    CC: None

## 2019-05-29 LAB — HIV ANTIBODY (ROUTINE TESTING W REFLEX): HIV Screen 4th Generation wRfx: NONREACTIVE

## 2019-05-29 LAB — CERVICOVAGINAL ANCILLARY ONLY
Bacterial Vaginitis (gardnerella): NEGATIVE
Candida Glabrata: NEGATIVE
Candida Vaginitis: POSITIVE — AB
Chlamydia: NEGATIVE
Comment: NEGATIVE
Comment: NEGATIVE
Comment: NEGATIVE
Comment: NEGATIVE
Comment: NEGATIVE
Comment: NORMAL
Neisseria Gonorrhea: NEGATIVE
Trichomonas: NEGATIVE

## 2019-05-29 LAB — HEPATITIS C ANTIBODY: Hep C Virus Ab: <0.1 s/co ratio (ref 0.0–0.9)

## 2019-05-29 LAB — RPR: RPR Ser Ql: NONREACTIVE

## 2019-05-29 LAB — HEPATITIS B SURFACE ANTIGEN: Hepatitis B Surface Ag: NEGATIVE

## 2019-05-30 ENCOUNTER — Other Ambulatory Visit: Payer: Self-pay | Admitting: Obstetrics

## 2019-05-30 DIAGNOSIS — B3731 Acute candidiasis of vulva and vagina: Secondary | ICD-10-CM

## 2019-05-30 DIAGNOSIS — B373 Candidiasis of vulva and vagina: Secondary | ICD-10-CM

## 2019-05-30 MED ORDER — FLUCONAZOLE 150 MG PO TABS: 150.0000 mg | ORAL_TABLET | Freq: Once | ORAL | 0 refills | Status: DC

## 2019-06-03 LAB — CYTOLOGY - PAP
Comment: NEGATIVE
Diagnosis: UNDETERMINED — AB
High risk HPV: NEGATIVE

## 2019-08-13 ENCOUNTER — Telehealth: Payer: Self-pay

## 2019-08-13 NOTE — Telephone Encounter (Signed)
TC from pt regarding elevated B/P pt states prescribed B/P Rx is not working.   Please advise.

## 2019-08-15 ENCOUNTER — Other Ambulatory Visit: Payer: Self-pay | Admitting: Obstetrics

## 2019-08-15 DIAGNOSIS — I10 Essential (primary) hypertension: Secondary | ICD-10-CM

## 2019-08-15 NOTE — Telephone Encounter (Signed)
I cannot manage her BP long-term.  I made a referral to Internal Medicine for routine health maintenance.

## 2019-08-18 ENCOUNTER — Other Ambulatory Visit: Payer: Self-pay | Admitting: Obstetrics

## 2019-08-18 DIAGNOSIS — B3731 Acute candidiasis of vulva and vagina: Secondary | ICD-10-CM

## 2019-08-18 DIAGNOSIS — B373 Candidiasis of vulva and vagina: Secondary | ICD-10-CM

## 2019-09-08 ENCOUNTER — Other Ambulatory Visit: Payer: Self-pay | Admitting: Obstetrics

## 2019-09-08 DIAGNOSIS — K2100 Gastro-esophageal reflux disease with esophagitis, without bleeding: Secondary | ICD-10-CM

## 2019-10-06 DIAGNOSIS — H40013 Open angle with borderline findings, low risk, bilateral: Secondary | ICD-10-CM | POA: Diagnosis not present

## 2019-11-15 ENCOUNTER — Other Ambulatory Visit: Payer: Self-pay | Admitting: Obstetrics

## 2019-11-15 DIAGNOSIS — B3731 Acute candidiasis of vulva and vagina: Secondary | ICD-10-CM

## 2019-12-10 ENCOUNTER — Other Ambulatory Visit: Payer: Self-pay

## 2019-12-10 MED ORDER — METRONIDAZOLE 500 MG PO TABS
500.0000 mg | ORAL_TABLET | Freq: Two times a day (BID) | ORAL | 0 refills | Status: DC
Start: 2019-12-10 — End: 2020-07-19

## 2019-12-10 NOTE — Progress Notes (Signed)
Rx for flagyl sent per protocol for symptoms of BV. Pt made aware.

## 2020-01-08 ENCOUNTER — Ambulatory Visit: Payer: Medicare Other | Admitting: Obstetrics

## 2020-01-14 ENCOUNTER — Ambulatory Visit: Payer: Medicare Other | Admitting: Obstetrics

## 2020-03-04 ENCOUNTER — Encounter: Payer: Self-pay | Admitting: Obstetrics

## 2020-03-04 ENCOUNTER — Other Ambulatory Visit: Payer: Self-pay

## 2020-03-04 ENCOUNTER — Other Ambulatory Visit (HOSPITAL_COMMUNITY)
Admission: RE | Admit: 2020-03-04 | Discharge: 2020-03-04 | Disposition: A | Discharge status: Home / Self Care | Payer: Medicare Other | Source: Ambulatory Visit | Attending: Obstetrics | Admitting: Obstetrics

## 2020-03-04 ENCOUNTER — Ambulatory Visit (INDEPENDENT_AMBULATORY_CARE_PROVIDER_SITE_OTHER): Payer: Medicare Other | Admitting: Obstetrics

## 2020-03-04 VITALS — BP 134/94 | HR 70 | Ht 62.0 in | Wt 216.8 lb

## 2020-03-04 DIAGNOSIS — I10 Essential (primary) hypertension: Secondary | ICD-10-CM | POA: Diagnosis not present

## 2020-03-04 DIAGNOSIS — N898 Other specified noninflammatory disorders of vagina: Secondary | ICD-10-CM | POA: Insufficient documentation

## 2020-03-04 DIAGNOSIS — L732 Hidradenitis suppurativa: Secondary | ICD-10-CM | POA: Diagnosis not present

## 2020-03-04 DIAGNOSIS — L0292 Furuncle, unspecified: Secondary | ICD-10-CM

## 2020-03-04 DIAGNOSIS — Z872 Personal history of diseases of the skin and subcutaneous tissue: Secondary | ICD-10-CM

## 2020-03-04 MED ORDER — FLUCONAZOLE 150 MG PO TABS: 150.0000 mg | ORAL_TABLET | Freq: Once | ORAL | 0 refills | Status: AC

## 2020-03-04 MED ORDER — METRONIDAZOLE 500 MG PO TABS: 500.0000 mg | ORAL_TABLET | Freq: Two times a day (BID) | ORAL | 2 refills | Status: DC

## 2020-03-04 NOTE — Patient Instructions (Signed)
Bacterial Vaginosis  Bacterial vaginosis is a vaginal infection that occurs when the normal balance of bacteria in the vagina is disrupted. It results from an overgrowth of certain bacteria. This is the most common vaginal infection among women ages 15-44. Because bacterial vaginosis increases your risk for STIs (sexually transmitted infections), getting treated can help reduce your risk for chlamydia, gonorrhea, herpes, and HIV (human immunodeficiency virus). Treatment is also important for preventing complications in pregnant women, because this condition can cause an early (premature) delivery. What are the causes? This condition is caused by an increase in harmful bacteria that are normally present in small amounts in the vagina. However, the reason that the condition develops is not fully understood. What increases the risk? The following factors may make you more likely to develop this condition:  Having a new sexual partner or multiple sexual partners.  Having unprotected sex.  Douching.  Having an intrauterine device (IUD).  Smoking.  Drug and alcohol abuse.  Taking certain antibiotic medicines.  Being pregnant. You cannot get bacterial vaginosis from toilet seats, bedding, swimming pools, or contact with objects around you. What are the signs or symptoms? Symptoms of this condition include:  Grey or white vaginal discharge. The discharge can also be watery or foamy.  A fish-like odor with discharge, especially after sexual intercourse or during menstruation.  Itching in and around the vagina.  Burning or pain with urination. Some women with bacterial vaginosis have no signs or symptoms. How is this diagnosed? This condition is diagnosed based on:  Your medical history.  A physical exam of the vagina.  Testing a sample of vaginal fluid under a microscope to look for a large amount of bad bacteria or abnormal cells. Your health care provider may use a cotton swab or  a small wooden spatula to collect the sample. How is this treated? This condition is treated with antibiotics. These may be given as a pill, a vaginal cream, or a medicine that is put into the vagina (suppository). If the condition comes back after treatment, a second round of antibiotics may be needed. Follow these instructions at home: Medicines  Take over-the-counter and prescription medicines only as told by your health care provider.  Take or use your antibiotic as told by your health care provider. Do not stop taking or using the antibiotic even if you start to feel better. General instructions  If you have a female sexual partner, tell her that you have a vaginal infection. She should see her health care provider and be treated if she has symptoms. If you have a female sexual partner, he does not need treatment.  During treatment: ? Avoid sexual activity until you finish treatment. ? Do not douche. ? Avoid alcohol as directed by your health care provider. ? Avoid breastfeeding as directed by your health care provider.  Drink enough water and fluids to keep your urine clear or pale yellow.  Keep the area around your vagina and rectum clean. ? Wash the area daily with warm water. ? Wipe yourself from front to back after using the toilet.  Keep all follow-up visits as told by your health care provider. This is important. How is this prevented?  Do not douche.  Wash the outside of your vagina with warm water only.  Use protection when having sex. This includes latex condoms and dental dams.  Limit how many sexual partners you have. To help prevent bacterial vaginosis, it is best to have sex with just one partner (  monogamous).  Make sure you and your sexual partner are tested for STIs.  Wear cotton or cotton-lined underwear.  Avoid wearing tight pants and pantyhose, especially during summer.  Limit the amount of alcohol that you drink.  Do not use any products that contain  nicotine or tobacco, such as cigarettes and e-cigarettes. If you need help quitting, ask your health care provider.  Do not use illegal drugs. Where to find more information  Centers for Disease Control and Prevention: SolutionApps.co.za  American Sexual Health Association (ASHA): www.ashastd.org  U.[place] Department of Health and Health and safety inspector, Office on Women's Health: ConventionalMedicines.si or http://www.anderson-williamson.info/ Contact a health care provider if:  Your symptoms do not improve, even after treatment.  You have more discharge or pain when urinating.  You have a fever.  You have pain in your abdomen.  You have pain during sex.  You have vaginal bleeding between periods. Summary  Bacterial vaginosis is a vaginal infection that occurs when the normal balance of bacteria in the vagina is disrupted.  Because bacterial vaginosis increases your risk for STIs (sexually transmitted infections), getting treated can help reduce your risk for chlamydia, gonorrhea, herpes, and HIV (human immunodeficiency virus). Treatment is also important for preventing complications in pregnant women, because the condition can cause an early (premature) delivery.  This condition is treated with antibiotic medicines. These may be given as a pill, a vaginal cream, or a medicine that is put into the vagina (suppository). This information is not intended to replace advice given to you by your health care provider. Make sure you discuss any questions you have with your health care provider. Document Revised: 05/25/2017 Document Reviewed: 02/26/2016 Elsevier Patient Education  2020 [employer].  Vaginitis Vaginitis is a condition in which the vaginal tissue swells and becomes red (inflamed). This condition is most often caused by a change in the normal balance of bacteria and yeast that live in the vagina. This change causes an overgrowth of certain bacteria or yeast, which  causes the inflammation. There are different types of vaginitis, but the most common types are:  Bacterial vaginosis.  Yeast infection (candidiasis).  Trichomoniasis vaginitis. This is a sexually transmitted disease (STD).  Viral vaginitis.  Atrophic vaginitis.  Allergic vaginitis. What are the causes? The cause of this condition depends on the type of vaginitis. It can be caused by:  Bacteria (bacterial vaginosis).  Yeast, which is a fungus (yeast infection).  A parasite (trichomoniasis vaginitis).  A virus (viral vaginitis).  Low hormone levels (atrophic vaginitis). Low hormone levels can occur during pregnancy, breastfeeding, or after menopause.  Irritants, such as bubble baths, scented tampons, and feminine sprays (allergic vaginitis). Other factors can change the normal balance of the yeast and bacteria that live in the vagina. These include:  Antibiotic medicines.  Poor hygiene.  Diaphragms, vaginal sponges, spermicides, birth control pills, and intrauterine devices (IUD).  Sex.  Infection.  Uncontrolled diabetes.  A weakened defense (immune) system. What increases the risk? This condition is more likely to develop in women who:  Smoke.  Use vaginal douches, scented tampons, or scented sanitary pads.  Wear tight-fitting pants.  Wear thong underwear.  Use oral birth control pills or an IUD.  Have sex without a condom.  Have multiple sex partners.  Have an STD.  Frequently use the spermicide nonoxynol-9.  Eat lots of foods high in sugar.  Have uncontrolled diabetes.  Have low estrogen levels.  Have a weakened immune system from an immune disorder or medical treatment.  Are pregnant or breastfeeding. What are the signs or symptoms? Symptoms vary depending on the cause of the vaginitis. Common symptoms include:  Abnormal vaginal discharge. ? The discharge is white, gray, or yellow with bacterial vaginosis. ? The discharge is thick, white,  and cheesy with a yeast infection. ? The discharge is frothy and yellow or greenish with trichomoniasis.  A bad vaginal smell. The smell is fishy with bacterial vaginosis.  Vaginal itching, pain, or swelling.  Sex that is painful.  Pain or burning when urinating. Sometimes there are no symptoms. How is this diagnosed? This condition is diagnosed based on your symptoms and medical history. A physical exam, including a pelvic exam, will also be done. You may also have other tests, including:  Tests to determine the pH level (acidity or alkalinity) of your vagina.  A whiff test, to assess the odor that results when a sample of your vaginal discharge is mixed with a potassium hydroxide solution.  Tests of vaginal fluid. A sample will be examined under a microscope. How is this treated? Treatment varies depending on the type of vaginitis you have. Your treatment may include:  Antibiotic creams or pills to treat bacterial vaginosis and trichomoniasis.  Antifungal medicines, such as vaginal creams or suppositories, to treat a yeast infection.  Medicine to ease discomfort if you have viral vaginitis. Your sexual partner should also be treated.  Estrogen delivered in a cream, pill, suppository, or vaginal ring to treat atrophic vaginitis. If vaginal dryness occurs, lubricants and moisturizing creams may help. You may need to avoid scented soaps, sprays, or douches.  Stopping use of a product that is causing allergic vaginitis. Then using a vaginal cream to treat the symptoms. Follow these instructions at home: Lifestyle  Keep your genital area clean and dry. Avoid soap, and only rinse the area with water.  Do not douche or use tampons until your health care provider says it is okay to do so. Use sanitary pads, if needed.  Do not have sex until your health care provider approves. When you can return to sex, practice safe sex and use condoms.  Wipe from front to back. This avoids the  spread of bacteria from the rectum to the vagina. General instructions  Take over-the-counter and prescription medicines only as told by your health care provider.  If you were prescribed an antibiotic medicine, take or use it as told by your health care provider. Do not stop taking or using the antibiotic even if you start to feel better.  Keep all follow-up visits as told by your health care provider. This is important. How is this prevented?  Use mild, non-scented products. Do not use things that can irritate the vagina, such as fabric softeners. Avoid the following products if they are scented: ? Feminine sprays. ? Detergents. ? Tampons. ? Feminine hygiene products. ? Soaps or bubble baths.  Let air reach your genital area. ? Wear cotton underwear to reduce moisture buildup. ? Avoid wearing underwear while you sleep. ? Avoid wearing tight pants and underwear or nylons without a cotton panel. ? Avoid wearing thong underwear.  Take off any wet clothing, such as bathing suits, as soon as possible.  Practice safe sex and use condoms. Contact a health care provider if:  You have abdominal pain.  You have a fever.  You have symptoms that last for more than 2-3 days. Get help right away if:  You have a fever and your symptoms suddenly get worse. Summary  Vaginitis  is a condition in which the vaginal tissue becomes inflamed.This condition is most often caused by a change in the normal balance of bacteria and yeast that live in the vagina.  Treatment varies depending on the type of vaginitis you have.  Do not douche, use tampons , or have sex until your health care provider approves. When you can return to sex, practice safe sex and use condoms. This information is not intended to replace advice given to you by your health care provider. Make sure you discuss any questions you have with your health care provider. Document Revised: 05/25/2017 Document Reviewed:  07/18/2016 Elsevier Patient Education  2020 [employer].

## 2020-03-04 NOTE — Progress Notes (Signed)
Pt is in the office for GYN visit. Pt complains of vaginal discharge and odor.

## 2020-03-04 NOTE — Progress Notes (Signed)
Patient ID: Christy Huang, female   DOB: [birth year], 39 y.o.   MRN: [id number]  Chief Complaint  Patient presents with  . GYN    HPI Christy Huang is a 39 y.o. female.  Malodorous vaginal discharge. HPI  Past Medical History:  Diagnosis Date  . Acne   . Anemia   . GERD (gastroesophageal reflux disease)   . Heart murmur    as a child   . Hypertension     Past Surgical History:  Procedure Laterality Date  . HYDRADENITIS EXCISION Right 05/05/2016   Procedure: EXCISION OF RIGHT AXILLARY HIDRADENITIS;  Surgeon: Berna Bue, MD;  Location: [facility];  Service: General;  Laterality: Right;  . HYDRADENITIS EXCISION Left 10/06/2016   Procedure: EXCISION OF LEFT AXILLARY HIDRADENITIS;  Surgeon: Berna Bue, MD;  Location: [facility];  Service: General;  Laterality: Left;  . Hydradentitis     Left  side 4/13    Family History  Problem Relation Age of Onset  . Diabetes Mother   . Hypertension Mother   . Diabetes Maternal Grandmother   . Hypertension Maternal Grandmother     Social History Social History   Tobacco Use  . Smoking status: Never Smoker  . Smokeless tobacco: Never Used  Vaping Use  . Vaping Use: Never used  Substance Use Topics  . Alcohol use: No  . Drug use: Yes    Types: Marijuana    Comment: social     Allergies  Allergen Reactions  . Other Hives    Allergic to TB test per patient   . Sulfa Antibiotics Hives    Reaction "I just can't have it"    Current Outpatient Medications  Medication Sig Dispense Refill  . doxycycline (VIBRAMYCIN) 100 MG capsule Take 1 capsule (100 mg total) by mouth 2 (two) times daily. 60 capsule 5  . Multiple Vitamins-Minerals (HAIR SKIN AND NAILS FORMULA) TABS Take 2 tablets by mouth daily.    . Omega-3 Fatty Acids (FISH OIL) 1000 MG CAPS Take 1,000 mg by mouth daily.     Marland Kitchen omeprazole (PRILOSEC) 20 MG capsule TAKE 1 CAPSULE (20 MG TOTAL) BY MOUTH 2 (TWO) TIMES DAILY BEFORE A MEAL. 180 capsule 1  .  triamterene-hydrochlorothiazide (DYAZIDE) 37.5-25 MG capsule Take 1 each (1 capsule total) by mouth daily after breakfast. 90 capsule 3  . Bioflavonoid Products (VITAMIN C) CHEW Chew 1 tablet by mouth. (Patient not taking: Reported on 03/04/2020)    . clindamycin (CLEOCIN T) 1 % external solution APPLY TOPICALLY 2 (TWO) TIMES DAILY. (Patient not taking: Reported on 03/04/2020) 60 mL 5  . fluconazole (DIFLUCAN) 150 MG tablet TAKE 1 TABLET BY MOUTH AS ONE DOSE (Patient not taking: Reported on 03/04/2020) 1 tablet 0  . fluconazole (DIFLUCAN) 150 MG tablet Take 1 tablet (150 mg total) by mouth once for 1 dose. 1 tablet 0  . Ginkgo Biloba 60 MG TABS Take 1 tablet by mouth daily. (Patient not taking: Reported on 03/04/2020)    . metroNIDAZOLE (FLAGYL) 500 MG tablet Take 1 tablet (500 mg total) by mouth 2 (two) times daily. (Patient not taking: Reported on 03/04/2020) 14 tablet 0  . metroNIDAZOLE (FLAGYL) 500 MG tablet Take 1 tablet (500 mg total) by mouth 2 (two) times daily. 14 tablet 2   No current facility-administered medications for this visit.    Review of Systems Review of Systems Constitutional: negative for fatigue and weight loss Respiratory: negative for cough and wheezing Cardiovascular: negative for chest pain,  fatigue and palpitations Gastrointestinal: negative for abdominal pain and change in bowel habits Genitourinary:positive for malodorous vaginal discharge Integument/breast: positive for boils in multiple sites / negative for nipple discharge Musculoskeletal:negative for myalgias Neurological: negative for gait problems and tremors Behavioral/Psych: negative for abusive relationship, depression Endocrine: negative for temperature intolerance      Blood pressure (!) 134/94, pulse 70, height 5\' 2"  (1.575 m), weight 216 lb 12.8 oz (98.3 kg), last menstrual period 02/23/2020.  Physical Exam Physical Exam General:   alert and no distress  Skin:   no rash or abnormalities  Lungs:   clear  to auscultation bilaterally  Heart:   regular rate and rhythm, S1, S2 normal, no murmur, click, rub or gallop  Breasts:   normal without suspicious masses, skin or nipple changes or axillary nodes  Abdomen:  very tender in right corner of C/S scar.  No masses felt  Pelvis:  External genitalia: normal general appearance Urinary system: urethral meatus normal and bladder without fullness, nontender Vaginal: normal without tenderness, induration or masses Cervix: normal appearance Adnexa: normal bimanual exam Uterus: anteverted and non-tender, normal size    50% of 20 min visit spent on counseling and coordination of care.   Data Reviewed Wet Prep  Assessment     1. Vaginal discharge Rx: - Cervicovaginal ancillary only( [place]) - metroNIDAZOLE (FLAGYL) 500 MG tablet; Take 1 tablet (500 mg total) by mouth 2 (two) times daily.  Dispense: 14 tablet; Refill: 2 - fluconazole (DIFLUCAN) 150 MG tablet; Take 1 tablet (150 mg total) by mouth once for 1 dose.  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 Boils of multiple sites - Doxy prn  4. H/O pilonidal cyst - will need General Surgery management  5. Essential hypertension - referred to [facility]    Plan   Follow up prn  Orders Placed This Encounter  Procedures  . Ambulatory referral to [facility]    Referral Priority:   Routine    Referral Type:   Consultation    Referral Reason:   Specialty Services Required    Requested Specialty:   Family Medicine    Number of Visits Requested:   1   Meds ordered this encounter  Medications  . metroNIDAZOLE (FLAGYL) 500 MG tablet    Sig: Take 1 tablet (500 mg total) by mouth 2 (two) times daily.    Dispense:  14 tablet    Refill:  2  . fluconazole (DIFLUCAN) 150 MG tablet    Sig: Take 1 tablet (150 mg total) by mouth once for 1 dose.    Dispense:  1 tablet    Refill:  0      [facility], MD 03/04/2020 10:58 AM

## 2020-03-08 ENCOUNTER — Other Ambulatory Visit: Payer: Self-pay | Admitting: Obstetrics

## 2020-03-08 ENCOUNTER — Other Ambulatory Visit: Payer: Self-pay

## 2020-03-08 LAB — CERVICOVAGINAL ANCILLARY ONLY
Bacterial Vaginitis (gardnerella): POSITIVE — AB
Candida Glabrata: NEGATIVE
Candida Vaginitis: NEGATIVE
Chlamydia: NEGATIVE
Comment: NEGATIVE
Comment: NEGATIVE
Comment: NEGATIVE
Comment: NEGATIVE
Comment: NEGATIVE
Comment: NORMAL
Neisseria Gonorrhea: NEGATIVE
Trichomonas: NEGATIVE

## 2020-03-08 MED ORDER — METRONIDAZOLE 1 % EX GEL
Freq: Every day | CUTANEOUS | 0 refills | Status: DC
Start: 2020-03-08 — End: 2023-06-22

## 2020-03-08 NOTE — Telephone Encounter (Signed)
Patient is having a hard time tolerating the flagyl by mouth. She would like to try Metrogel instead.

## 2020-03-09 ENCOUNTER — Other Ambulatory Visit: Payer: Self-pay

## 2020-03-09 ENCOUNTER — Other Ambulatory Visit: Payer: Self-pay | Admitting: Obstetrics

## 2020-03-09 DIAGNOSIS — N898 Other specified noninflammatory disorders of vagina: Secondary | ICD-10-CM

## 2020-03-09 MED ORDER — METRONIDAZOLE 0.75 % VA GEL: 1.0000 | Freq: Every day | VAGINAL | 0 refills | Status: DC

## 2020-03-09 NOTE — Progress Notes (Signed)
MetroGel Rx sent.  Ok per provider to send wrong Rx was sent in error for topical treatment.

## 2020-03-15 ENCOUNTER — Other Ambulatory Visit: Payer: Self-pay | Admitting: Obstetrics

## 2020-03-15 DIAGNOSIS — L732 Hidradenitis suppurativa: Secondary | ICD-10-CM

## 2020-04-19 ENCOUNTER — Ambulatory Visit: Payer: Medicare Other | Admitting: Family Medicine

## 2020-05-03 DIAGNOSIS — L0231 Cutaneous abscess of buttock: Secondary | ICD-10-CM | POA: Diagnosis not present

## 2020-05-03 DIAGNOSIS — L0501 Pilonidal cyst with abscess: Secondary | ICD-10-CM | POA: Diagnosis not present

## 2020-05-07 ENCOUNTER — Ambulatory Visit: Payer: Medicare Other | Admitting: Family Medicine

## 2020-05-07 NOTE — Progress Notes (Deleted)
   Subjective:    Patient ID: Christy Huang, female    DOB: Jul 14, 1980, 39 y.o.   MRN: [id number]   CC: Establish care  HPI:  Christy Huang is a very pleasant 39 y.o. female who presents today to establish care.  Initial concerns:***  Past medical history: Hidradenitis  Past surgical history:***  Current medications:***  Family history:***  Social history:***  ROS: pertinent noted in the HPI   Objective:  There were no vitals taken for this visit.  Vitals and nursing note reviewed  General: NAD, pleasant, able to participate in exam Cardiac: RRR, S1 S2 present. normal heart sounds, no murmurs. Respiratory: CTAB, normal effort, No wheezes, rales or rhonchi Abdomen: Bowel sounds present, non-tender, non-distended, no hepatosplenomegaly Extremities: no edema or cyanosis. Skin: warm and dry, no rashes noted Neuro: alert, no obvious focal deficits Psych: Normal affect and mood   Assessment & Plan:    No problem-specific Assessment & Plan notes found for this encounter.    Jackelyn Poling, DO [facility] PGY-1

## 2020-05-14 ENCOUNTER — Other Ambulatory Visit: Payer: Self-pay | Admitting: Obstetrics

## 2020-05-14 DIAGNOSIS — I10 Essential (primary) hypertension: Secondary | ICD-10-CM

## 2020-05-14 DIAGNOSIS — K2100 Gastro-esophageal reflux disease with esophagitis, without bleeding: Secondary | ICD-10-CM

## 2020-05-29 ENCOUNTER — Other Ambulatory Visit: Payer: Self-pay | Admitting: Obstetrics

## 2020-05-29 DIAGNOSIS — B373 Candidiasis of vulva and vagina: Secondary | ICD-10-CM

## 2020-05-29 DIAGNOSIS — B3731 Acute candidiasis of vulva and vagina: Secondary | ICD-10-CM

## 2020-07-12 ENCOUNTER — Other Ambulatory Visit: Payer: Self-pay | Admitting: Obstetrics

## 2020-07-12 DIAGNOSIS — N898 Other specified noninflammatory disorders of vagina: Secondary | ICD-10-CM

## 2020-07-12 NOTE — Telephone Encounter (Signed)
TC to patient she reports having itching and burning follow by fishy odor. She thinks its  BV and is requesting metrogel be called into her pharmacy.

## 2020-07-13 ENCOUNTER — Ambulatory Visit: Payer: Medicare Other | Admitting: Obstetrics

## 2020-07-19 ENCOUNTER — Other Ambulatory Visit (HOSPITAL_COMMUNITY)
Admission: RE | Admit: 2020-07-19 | Discharge: 2020-07-19 | Disposition: A | Discharge status: Home / Self Care | Payer: Medicare Other | Source: Ambulatory Visit | Attending: Obstetrics | Admitting: Obstetrics

## 2020-07-19 ENCOUNTER — Other Ambulatory Visit: Payer: Self-pay

## 2020-07-19 ENCOUNTER — Ambulatory Visit (INDEPENDENT_AMBULATORY_CARE_PROVIDER_SITE_OTHER): Payer: Medicare Other | Admitting: Obstetrics

## 2020-07-19 ENCOUNTER — Encounter: Payer: Self-pay | Admitting: Obstetrics

## 2020-07-19 VITALS — BP 124/90 | HR 76 | Wt 201.1 lb

## 2020-07-19 DIAGNOSIS — N898 Other specified noninflammatory disorders of vagina: Secondary | ICD-10-CM | POA: Insufficient documentation

## 2020-07-19 DIAGNOSIS — Z01419 Encounter for gynecological examination (general) (routine) without abnormal findings: Secondary | ICD-10-CM

## 2020-07-19 DIAGNOSIS — E669 Obesity, unspecified: Secondary | ICD-10-CM | POA: Diagnosis not present

## 2020-07-19 DIAGNOSIS — I10 Essential (primary) hypertension: Secondary | ICD-10-CM

## 2020-07-19 DIAGNOSIS — Z124 Encounter for screening for malignant neoplasm of cervix: Secondary | ICD-10-CM

## 2020-07-19 DIAGNOSIS — Z113 Encounter for screening for infections with a predominantly sexual mode of transmission: Secondary | ICD-10-CM

## 2020-07-19 NOTE — Addendum Note (Signed)
Addended by: Coral Ceo A on: 07/19/2020 02:11 PM   Modules accepted: Orders

## 2020-07-19 NOTE — Progress Notes (Addendum)
Subjective:        Christy Huang is a 40 y.o. female here for a routine exam.  Current complaints: None.    Personal health questionnaire:  Is patient Christy Huang, have a family history of breast and/or ovarian cancer: no Is there a family history of uterine cancer diagnosed at age < 20, gastrointestinal cancer, urinary tract cancer, family member who is a Personnel officer syndrome-associated carrier: no Is the patient overweight and hypertensive, family history of diabetes, personal history of gestational diabetes, preeclampsia or PCOS: no Is patient over 16, have PCOS,  family history of premature CHD under age 74, diabetes, smoke, have hypertension or peripheral artery disease:  no At any time, has a partner hit, kicked or otherwise hurt or frightened you?: no Over the past 2 weeks, have you felt down, depressed or hopeless?: no Over the past 2 weeks, have you felt little interest or pleasure in doing things?:no   Gynecologic History Patient's last menstrual period was 07/10/2020. Contraception: none Last Pap: 05-28-2019. Results were: ASCUS with negative HRHPV Last mammogram: n/a. Results were: n/a  Obstetric History OB History  Gravida Para Term Preterm AB Living  3         3  SAB IAB Ectopic Multiple Live Births               # Outcome Date GA Lbr Len/2nd Weight Sex Delivery Anes PTL Lv  3 Gravida           2 Gravida           1 [place]             Past Medical History:  Diagnosis Date  . Acne   . Anemia   . GERD (gastroesophageal reflux disease)   . Heart murmur    as a child   . Hypertension     Past Surgical History:  Procedure Laterality Date  . HYDRADENITIS EXCISION Right 05/05/2016   Procedure: EXCISION OF RIGHT AXILLARY HIDRADENITIS;  Surgeon: Berna Bue, MD;  Location: [facility];  Service: General;  Laterality: Right;  . HYDRADENITIS EXCISION Left 10/06/2016   Procedure: EXCISION OF LEFT AXILLARY HIDRADENITIS;  Surgeon: Berna Bue, MD;   Location: [facility];  Service: General;  Laterality: Left;  . Hydradentitis     Left  side 4/13     Current Outpatient Medications:  .  Bioflavonoid Products (VITAMIN C) CHEW, Chew 1 tablet by mouth., Disp: , Rfl:  .  doxycycline (VIBRAMYCIN) 100 MG capsule, TAKE 1 CAPSULE BY MOUTH TWICE A DAY, Disp: 60 capsule, Rfl: 5 .  Ginkgo Biloba 60 MG TABS, Take 1 tablet by mouth daily., Disp: , Rfl:  .  Multiple Vitamins-Minerals (HAIR SKIN AND NAILS FORMULA) TABS, Take 2 tablets by mouth daily., Disp: , Rfl:  .  Omega-3 Fatty Acids (FISH OIL) 1000 MG CAPS, Take 1,000 mg by mouth daily. , Disp: , Rfl:  .  clindamycin (CLEOCIN T) 1 % external solution, APPLY TOPICALLY 2 (TWO) TIMES DAILY. (Patient not taking: No sig reported), Disp: 60 mL, Rfl: 5 .  fluconazole (DIFLUCAN) 150 MG tablet, TAKE 1 TABLET BY MOUTH AS ONE DOSE (Patient not taking: Reported on 07/19/2020), Disp: 1 tablet, Rfl: 0 .  metroNIDAZOLE (FLAGYL) 500 MG tablet, Take 1 tablet (500 mg total) by mouth 2 (two) times daily. (Patient not taking: No sig reported), Disp: 14 tablet, Rfl: 0 .  metroNIDAZOLE (FLAGYL) 500 MG tablet, Take 1 tablet (500 mg total) by mouth  2 (two) times daily. (Patient not taking: Reported on 07/19/2020), Disp: 14 tablet, Rfl: 2 .  metroNIDAZOLE (METROGEL) 0.75 % vaginal gel, PLACE 1 APPLICATORFUL VAGINALLY AT BEDTIME. APPLY ONE APPLICATORFUL TO VAGINA AT BEDTIME FOR 5 DAYS (Patient not taking: Reported on 07/19/2020), Disp: 70 g, Rfl: 0 .  metroNIDAZOLE (METROGEL) 1 % gel, Apply topically daily. 1 applicator vaginally for five days. (Patient not taking: Reported on 07/19/2020), Disp: 45 g, Rfl: 0 .  omeprazole (PRILOSEC) 20 MG capsule, TAKE 1 CAPSULE (20 MG TOTAL) BY MOUTH 2 (TWO) TIMES DAILY BEFORE A MEAL. (Patient not taking: Reported on 07/19/2020), Disp: 180 capsule, Rfl: 1 .  triamterene-hydrochlorothiazide (DYAZIDE) 37.5-25 MG capsule, TAKE 1 EACH (1 CAPSULE TOTAL) BY MOUTH DAILY AFTER BREAKFAST. (Patient not taking:  Reported on 07/19/2020), Disp: 90 capsule, Rfl: 3 Allergies  Allergen Reactions  . Other Hives    Allergic to TB test per patient   . Sulfa Antibiotics Hives    Reaction "I just can't have it"    * Metronidazole tablets,  tolerates MetroGel vaginal fine            Reaction  " throat swells with tablets "  Social History   Tobacco Use  . Smoking status: Never Smoker  . Smokeless tobacco: Never Used  Substance Use Topics  . Alcohol use: No    Family History  Problem Relation Age of Onset  . Diabetes Mother   . Hypertension Mother   . Diabetes Maternal Grandmother   . Hypertension Maternal Grandmother       Review of Systems  Constitutional: negative for fatigue and weight loss Respiratory: negative for cough and wheezing Cardiovascular: negative for chest pain, fatigue and palpitations Gastrointestinal: negative for abdominal pain and change in bowel habits Musculoskeletal:negative for myalgias Neurological: negative for gait problems and tremors Behavioral/Psych: negative for abusive relationship, depression Endocrine: negative for temperature intolerance    Genitourinary:negative for abnormal menstrual periods, genital lesions, hot flashes, sexual problems and vaginal discharge Integument/breast: negative for breast lump, breast tenderness, nipple discharge and skin lesion(s)    Objective:       BP 124/90   Pulse 76   Wt 201 lb 1.6 oz (91.2 kg)   LMP 07/10/2020   BMI 36.78 kg/m  General:   alert and no distress  Skin:   no rash or abnormalities  Lungs:   clear to auscultation bilaterally  Heart:   regular rate and rhythm, S1, S2 normal, no murmur, click, rub or gallop  Breasts:   normal without suspicious masses, skin or nipple changes or axillary nodes  Abdomen:  normal findings: no organomegaly, soft, non-tender and no hernia  Pelvis:  External genitalia: normal general appearance Urinary system: urethral meatus normal and bladder without fullness,  nontender Vaginal: normal without tenderness, induration or masses Cervix: normal appearance Adnexa: normal bimanual exam Uterus: anteverted and non-tender, normal size   Lab Review Urine pregnancy test Labs reviewed yes Radiologic studies reviewed no  50% of 20 min visit spent on counseling and coordination of care.   Assessment:     1. Encounter for gynecological examination with Papanicolaou smear of cervix Rx: - Cytology - PAP( [facility]) - RPR+HBsAg+HCVAb+...  2. Vaginal discharge Rx: - Cervicovaginal ancillary only( [facility])  3. Screening for STD (sexually transmitted disease) Rx: - RPR+HBsAg+HCVAb+...  4. Obesity (BMI 35.0-39.9 without comorbidity) - program of caloric reduction, exercise and behavioral modification recommended  5. Essential hypertension - managed by PCP    Plan:  Education reviewed: calcium supplements, depression evaluation, low fat, low cholesterol diet, safe sex/STD prevention, self breast exams and weight bearing exercise. Contraception: none. Follow up in: 1 year.    Orders Placed This Encounter  Procedures  . RPR+HBsAg+HCVAb+...    Brock Bad, MD 07/19/2020 1:42 PM

## 2020-07-20 DIAGNOSIS — R87612 Low grade squamous intraepithelial lesion on cytologic smear of cervix (LGSIL): Secondary | ICD-10-CM | POA: Diagnosis not present

## 2020-07-20 LAB — CERVICOVAGINAL ANCILLARY ONLY
Bacterial Vaginitis (gardnerella): POSITIVE — AB
Candida Glabrata: NEGATIVE
Candida Vaginitis: NEGATIVE
Chlamydia: NEGATIVE
Comment: NEGATIVE
Comment: NEGATIVE
Comment: NEGATIVE
Comment: NEGATIVE
Comment: NEGATIVE
Comment: NORMAL
Neisseria Gonorrhea: NEGATIVE
Trichomonas: NEGATIVE

## 2020-07-20 LAB — RPR+HBSAG+HCVAB+...
HIV Screen 4th Generation wRfx: NONREACTIVE
Hep C Virus Ab: <0.1 s/co ratio (ref 0.0–0.9)
Hepatitis B Surface Ag: NEGATIVE
RPR Ser Ql: NONREACTIVE

## 2020-07-20 LAB — CYTOLOGY - PAP
Comment: NEGATIVE
High risk HPV: NEGATIVE

## 2020-07-21 ENCOUNTER — Other Ambulatory Visit: Payer: Self-pay | Admitting: Obstetrics

## 2020-07-21 ENCOUNTER — Other Ambulatory Visit: Payer: Self-pay

## 2020-07-21 DIAGNOSIS — L0292 Furuncle, unspecified: Secondary | ICD-10-CM

## 2020-07-21 DIAGNOSIS — N760 Acute vaginitis: Secondary | ICD-10-CM

## 2020-07-21 DIAGNOSIS — B9689 Other specified bacterial agents as the cause of diseases classified elsewhere: Secondary | ICD-10-CM

## 2020-07-21 MED ORDER — METRONIDAZOLE 0.75 % VA GEL: 1.0000 | Freq: Two times a day (BID) | VAGINAL | 0 refills | Status: DC

## 2020-07-21 MED ORDER — CLINDAMYCIN HCL 300 MG PO CAPS: 300.0000 mg | ORAL_CAPSULE | Freq: Three times a day (TID) | ORAL | 0 refills | Status: DC

## 2020-08-04 ENCOUNTER — Other Ambulatory Visit: Payer: Self-pay

## 2020-08-04 ENCOUNTER — Ambulatory Visit (INDEPENDENT_AMBULATORY_CARE_PROVIDER_SITE_OTHER): Payer: Medicare Other | Admitting: Obstetrics

## 2020-08-04 ENCOUNTER — Other Ambulatory Visit (HOSPITAL_COMMUNITY)
Admission: RE | Admit: 2020-08-04 | Discharge: 2020-08-04 | Disposition: A | Discharge status: Home / Self Care | Payer: Medicare Other | Source: Ambulatory Visit | Attending: Obstetrics | Admitting: Obstetrics

## 2020-08-04 ENCOUNTER — Encounter: Payer: Self-pay | Admitting: Obstetrics

## 2020-08-04 VITALS — BP 143/99 | HR 99 | Ht 62.0 in | Wt 195.0 lb

## 2020-08-04 DIAGNOSIS — R87612 Low grade squamous intraepithelial lesion on cytologic smear of cervix (LGSIL): Secondary | ICD-10-CM | POA: Insufficient documentation

## 2020-08-04 DIAGNOSIS — B373 Candidiasis of vulva and vagina: Secondary | ICD-10-CM | POA: Insufficient documentation

## 2020-08-04 DIAGNOSIS — B3731 Acute candidiasis of vulva and vagina: Secondary | ICD-10-CM

## 2020-08-04 DIAGNOSIS — N870 Mild cervical dysplasia: Secondary | ICD-10-CM | POA: Diagnosis not present

## 2020-08-04 LAB — POCT URINE PREGNANCY: Preg Test, Ur: NEGATIVE

## 2020-08-04 MED ORDER — FLUCONAZOLE 150 MG PO TABS: 150.0000 mg | ORAL_TABLET | Freq: Once | ORAL | 0 refills | Status: AC

## 2020-08-04 NOTE — Progress Notes (Signed)
RGYN patient presents for Colpo today.   Last pap: 07/19/20  LSIL Neg HPV  GUR:[id number]   Last had intercourse in December.

## 2020-08-04 NOTE — Progress Notes (Signed)
Colposcopy Procedure Note  Indications: Pap smear 1 months ago showed: low-grade squamous intraepithelial neoplasia (LGSIL - encompassing HPV,mild dysplasia,CIN I). The prior pap showed ASCUS with NEGATIVE high risk HPV.  Prior cervical/vaginal disease: normal exam without visible pathology. Prior cervical treatment: no treatment.  Procedure Details  The risks and benefits of the procedure and Written informed consent obtained.  A time-out was performed confirming the patient, procedure and allergy status  Speculum placed in vagina and excellent visualization of cervix achieved, cervix swabbed x 3 with acetic acid solution.  Findings: Cervix: acetowhite lesion(s) noted at 6 o'clock; SCJ visualized 360 degrees without lesions, endocervical curettage performed, cervical biopsies taken at 6 and 12 o'clock, specimen labelled and sent to pathology and hemostasis achieved with silver nitrate.   Vaginal inspection: normal without visible lesions. Vulvar colposcopy: vulvar colposcopy not performed.   Physical Exam   Specimens: ECC and Cervical Biopsies  Complications: none.  Plan: Specimens labelled and sent to Pathology. Will base further treatment on Pathology findings. Treatment options discussed with patient. Post biopsy instructions given to patient. Return to discuss Pathology results in 2 weeks.  Brock Bad, MD 08/04/2020 9:04 AM

## 2020-08-05 LAB — CERVICOVAGINAL ANCILLARY ONLY
Bacterial Vaginitis (gardnerella): NEGATIVE
Candida Glabrata: NEGATIVE
Candida Vaginitis: NEGATIVE
Chlamydia: NEGATIVE
Comment: NEGATIVE
Comment: NEGATIVE
Comment: NEGATIVE
Comment: NEGATIVE
Comment: NEGATIVE
Comment: NORMAL
Neisseria Gonorrhea: NEGATIVE
Trichomonas: NEGATIVE

## 2020-08-06 LAB — SURGICAL PATHOLOGY

## 2020-08-18 ENCOUNTER — Telehealth (INDEPENDENT_AMBULATORY_CARE_PROVIDER_SITE_OTHER): Payer: Medicare Other | Admitting: Obstetrics

## 2020-08-18 ENCOUNTER — Encounter: Payer: Self-pay | Admitting: Obstetrics

## 2020-08-18 DIAGNOSIS — R87612 Low grade squamous intraepithelial lesion on cytologic smear of cervix (LGSIL): Secondary | ICD-10-CM | POA: Diagnosis not present

## 2020-08-18 NOTE — Progress Notes (Signed)
GYNECOLOGY VIRTUAL VISIT ENCOUNTER NOTE  Provider location: Center for [facility] [place]   I connected with [name] on 08/18/20 at  8:15 AM EST by MyChart Video Encounter at home and verified that I am speaking with the correct person using two identifiers.   I discussed the limitations, risks, security and privacy concerns of performing an evaluation and management service virtually and the availability of in person appointments. I also discussed with the patient that there may be a patient responsible charge related to this service. The patient expressed understanding and agreed to proceed.   History:  Christy Huang is a 40 y.o. G3P0 female being evaluated today for results of colposcopic biopsies for LGSIL pap smear. She denies any abnormal vaginal discharge, bleeding, pelvic pain or other concerns.       Past Medical History:  Diagnosis Date  . Acne   . Anemia   . GERD (gastroesophageal reflux disease)   . Heart murmur    as a child   . Hypertension    Past Surgical History:  Procedure Laterality Date  . HYDRADENITIS EXCISION Right 05/05/2016   Procedure: EXCISION OF RIGHT AXILLARY HIDRADENITIS;  Surgeon: Berna Bue, MD;  Location: [facility];  Service: General;  Laterality: Right;  . HYDRADENITIS EXCISION Left 10/06/2016   Procedure: EXCISION OF LEFT AXILLARY HIDRADENITIS;  Surgeon: Berna Bue, MD;  Location: [facility];  Service: General;  Laterality: Left;  . Hydradentitis     Left  side 4/13   The following portions of the patient's history were reviewed and updated as appropriate: allergies, current medications, past family history, past medical history, past social history, past surgical history and problem list.   Health Maintenance:  LGSIL pap and negative HRHPV on 07-19-2020.    Review of Systems:  Pertinent items noted in HPI and remainder of comprehensive ROS otherwise negative.  Physical Exam:   General:  Alert, oriented and  cooperative. Patient appears to be in no acute distress.  Mental Status: Normal mood and affect. Normal behavior. Normal judgment and thought content.   Respiratory: Normal respiratory effort, no problems with respiration noted  Rest of physical exam deferred due to type of encounter  Labs and Imaging Results for orders placed or performed in visit on 08/04/20 (from the past 336 hour(s))  POCT urine pregnancy   Collection Time: 08/04/20  9:11 AM  Result Value Ref Range   Preg Test, Ur Negative Negative  Surgical pathology( [facility])   Collection Time: 08/04/20  9:13 AM  Result Value Ref Range   SURGICAL PATHOLOGY      SURGICAL PATHOLOGY CASE: [id number] PATIENT: Christy Huang Surgical Pathology Report     Clinical History: LGSIL (cm)   FINAL MICROSCOPIC DIAGNOSIS:  A. ENDOCERVIX, CURETTAGE: -  Reactive squamous epithelium and benign endocervical glandular epithelium -  No malignancy identified -  See comment  B. CERVIX, 6 O'CLOCK, BIOPSY: -  Low grade squamous intraepithelial lesion (CIN1, mild dysplasia)  C. CERVIX, 12 O'CLOCK, BIOPSY: -  Low grade squamous intraepithelial lesion (CIN1, mild dysplasia)  COMMENT:  A.  P16 immunohistochemistry supports the absence of high-grade dysplasia.   GROSS DESCRIPTION:  Specimen A: Received in formalin is blood tinged mucus that is entirely submitted in one block.  Volume: 0.7 x 0.6 x 0.2 cm (1 B)  Specimen B: Received in formalin is a pink-white soft tissue fragment that is submitted in toto.  Size: 0.3 cm, 1 block submitted.  Specimen  C: Received in formalin is a pink-white soft tissue fragment that is submitted in  toto.  Size: 0.25 cm, 1 block submitted.  SW 08/04/2020   Final Diagnosis performed by Manning Charity, MD.   Electronically signed 08/06/2020 Technical and / or Professional components performed at [facility]. [facility], [address] [place], [place] [address].   Immunohistochemistry Technical component (if applicable) was performed at [facility]. [address], [address], [place], [place] [address].   IMMUNOHISTOCHEMISTRY DISCLAIMER (if applicable): Some of these immunohistochemical stains may have been developed and the performance characteristics determine by [facility]. Some may not have been cleared or approved by the U.S. Food and Drug Administration. The FDA has determined that such clearance or approval is not necessary. This test is used for clinical purposes. It should not be regarded as investigational or for research. This laboratory is certified under the Clinical Laboratory Improvement Amendments of 1988 (CLIA-88)  as qualified to perform high complexity clinical laboratory testing.  The controls stained appropriately.   Cervicovaginal ancillary only( [place])   Collection Time: 08/04/20  9:14 AM  Result Value Ref Range   Bacterial Vaginitis (gardnerella) Negative    Candida Vaginitis Negative    Candida Glabrata Negative    Trichomonas Negative    Chlamydia Negative    Neisseria Gonorrhea Negative    Comment      Normal Reference Range Bacterial Vaginosis - Negative   Comment Normal Reference Range Candida Species - Negative    Comment Normal Reference Range Candida Galbrata - Negative    Comment Normal Reference Range Trichomonas - Negative    Comment Normal Reference Ranger Chlamydia - Negative    Comment      Normal Reference Range Neisseria Gonorrhea - Negative   No results found.     Assessment and Plan:        1. 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 The patient was provided an opportunity to ask questions and all were answered. The patient agreed with the plan and demonstrated an understanding of the instructions.   The patient was advised to call back or seek an  in-person evaluation/go to the ED if the symptoms worsen or if the condition fails to improve as anticipated.  I provided 15 minutes of face-to-face time during this encounter.   Coral Ceo, MD Center for [facility], [facility] 08/18/20

## 2021-04-27 ENCOUNTER — Other Ambulatory Visit: Payer: Self-pay | Admitting: Obstetrics

## 2021-04-27 DIAGNOSIS — L732 Hidradenitis suppurativa: Secondary | ICD-10-CM

## 2021-07-12 DIAGNOSIS — L0591 Pilonidal cyst without abscess: Secondary | ICD-10-CM | POA: Diagnosis not present

## 2021-07-13 ENCOUNTER — Ambulatory Visit: Payer: Self-pay | Admitting: Surgery

## 2021-07-13 NOTE — H&P (Signed)
History of Present Illness: Christy Huang is a 41 y.o. female who is seen today for follow up. I saw her in November 2021 for a gluteal abscess, and an I&D was performed at that time. It was thought to be an infected pilonidal cyst, however the patient was lost to follow up. Today she presents with recurrent symptoms and would like to discuss surgery. She frequently has pain and swelling in the gluteal area, and has pain when she sits. The swelling comes and goes. She denies drainage.     Review of Systems: A complete review of systems was obtained from the patient.  I have reviewed this information and discussed as appropriate with the patient.  See HPI as well for other ROS.       Medical History: Past Medical History      Past Medical History:  Diagnosis Date   GERD (gastroesophageal reflux disease)     Hypertension          There is no problem list on file for this patient.     Past Surgical History       Past Surgical History:  Procedure Laterality Date   AXILLARY HIDRADENITIS EXCISION Right 05/05/2016   AXILLARY HIDRADENITIS EXCISION Left 10/06/2016        Allergies       Allergies  Allergen Reactions   Sulfa (Sulfonamide Antibiotics) Hives      Reaction "I just can't have it"              Current Outpatient Medications on File Prior to Visit  Medication Sig Dispense Refill   doxycycline (VIBRAMYCIN) 100 MG capsule Take 1 capsule by mouth 2 (two) times daily       omeprazole (PRILOSEC) 20 MG DR capsule Take by mouth       triamterene-hydrochlorothiazide (DYAZIDE) 37.5-25 mg capsule TAKE 1 EACH (1 CAPSULE TOTAL) BY MOUTH DAILY AFTER BREAKFAST.        No current facility-administered medications on file prior to visit.      Family History       Family History  Problem Relation Age of Onset   High blood pressure (Hypertension) Mother     Diabetes Mother          Social History       Tobacco Use  Smoking Status Never  Smokeless Tobacco Never       Social History  Social History        Socioeconomic History   Marital status: Single  Tobacco Use   Smoking status: Never   Smokeless tobacco: Never  Substance and Sexual Activity   Alcohol use: Never   Drug use: Never        Objective:         Vitals:    07/12/21 1100  BP: 130/80  Pulse: 68  Temp: 36.8 C (98.3 F)  SpO2: 98%  Weight: 92.5 kg (204 lb)  Height: 157.5 cm (5\' 2" )    Body mass index is 37.31 kg/m.   Physical Exam Vitals reviewed.  Constitutional:      General: She is not in acute distress.    Appearance: Normal appearance.  HENT:     Head: Normocephalic and atraumatic.  Eyes:     General: No scleral icterus.    Conjunctiva/sclera: Conjunctivae normal.  Pulmonary:     Effort: Pulmonary effort is normal. No respiratory distress.  Genitourinary:    Comments: Well-healed I&D site at the superior gluteal cleft. Area is clean and  dry, there is mild underlying swelling and tenderness to palpation, no large pits or sinuses. Skin:    General: Skin is warm and dry.     Coloration: Skin is not jaundiced.  Neurological:     General: No focal deficit present.     Mental Status: She is alert and oriented to person, place, and time.  Psychiatric:        Mood and Affect: Mood normal.        Behavior: Behavior normal.        Thought Content: Thought content normal.            Assessment and Plan:     Diagnoses and all orders for this visit:   Pilonidal cyst without abscess -     CCS Case Posting Request; Future       This is a 41 yo female presenting with recurrent pain and swelling in the superior gluteal cleft. Her symptoms are most consistent with a pilonidal cyst, although on exam her disease is currently mild. She does not currently have any signs of abscess. I discussed nonoperative treatment, I.e. keeping the area dry and free of hair, but she is already doing this and continues to have symptoms. She would prefer to proceed with surgical  excision. I discussed the details of the procedure and the expected postoperative wound care, and discussed it often takes several weeks for complete healing to occur after pilonidal cyst excision. She agrees to proceed with surgery and will be contacted to schedule an elective surgery date.  Sophronia Simas, MD [facility] Surgery General, Hepatobiliary and Pancreatic Surgery 07/13/21 1:54 PM

## 2021-07-24 ENCOUNTER — Other Ambulatory Visit: Payer: Self-pay | Admitting: Obstetrics

## 2021-07-24 DIAGNOSIS — I10 Essential (primary) hypertension: Secondary | ICD-10-CM

## 2021-07-25 NOTE — Patient Instructions (Signed)
DUE TO COVID-19 ONLY ONE VISITOR IS ALLOWED TO COME WITH YOU AND STAY IN THE WAITING ROOM ONLY DURING PRE OP AND PROCEDURE DAY OF SURGERY IF YOU ARE GOING HOME AFTER SURGERY. IF YOU ARE SPENDING THE NIGHT 2 PEOPLE MAY VISIT WITH YOU IN YOUR PRIVATE ROOM AFTER SURGERY UNTIL VISITING  HOURS ARE OVER AT 800 PM AND 1  VISITOR  MAY  SPEND THE NIGHT.                  Christy Huang     Your procedure is scheduled on: 08/18/21   Report to [facility] Main  Entrance   Report to short stay at 5:15 AM     Call this number if you have problems the morning of surgery [phone]    Remember: Do not eat food or drink  :After Midnight the night before your surgery,        BRUSH YOUR TEETH MORNING OF SURGERY AND RINSE YOUR MOUTH OUT, NO CHEWING GUM CANDY OR MINTS.     Take these medicines the morning of surgery with A SIP OF WATER: Omeprazole                                You may not have any metal on your body including hair pins and              piercings  Do not wear jewelry, make-up, lotions, powders or perfumes, deodorant             Do not wear nail polish on your fingernails.  Do not shave  48 hours prior to surgery.                Do not bring valuables to the hospital. [facility] IS NOT             RESPONSIBLE   FOR VALUABLES.  Contacts, dentures or bridgework may not be worn into surgery.     Patients discharged the day of surgery will not be allowed to drive home.  IF YOU ARE HAVING SURGERY AND GOING HOME THE SAME DAY, YOU MUST HAVE AN ADULT TO DRIVE YOU HOME AND BE WITH YOU FOR 24 HOURS. YOU MAY GO HOME BY TAXI OR UBER OR ORTHERWISE, BUT AN ADULT MUST ACCOMPANY YOU HOME AND STAY WITH YOU FOR 24 HOURS.  Name and phone number of your driver:  Special Instructions: N/A              Please read over the following fact sheets you were given: _____________________________________________________________________             [facility] - Preparing for Surgery Before  surgery, you can play an important role.  Because skin is not sterile, your skin needs to be as free of germs as possible.  You can reduce the number of germs on your skin by washing with CHG (chlorahexidine gluconate) soap before surgery.  CHG is an antiseptic cleaner which kills germs and bonds with the skin to continue killing germs even after washing. Please DO NOT use if you have an allergy to CHG or antibacterial soaps.  If your skin becomes reddened/irritated stop using the CHG and inform your nurse when you arrive at Short Stay. Do not shave (including legs and underarms) for at least 48 hours prior to the first CHG shower.   Please follow these instructions carefully:  1.  Shower with CHG Soap the night before surgery and the  morning of Surgery.  2.  If you choose to wash your hair, wash your hair first as usual with your  normal  shampoo.  3.  After you shampoo, rinse your hair and body thoroughly to remove the  shampoo.                            4.  Use CHG as you would any other liquid soap.  You can apply chg directly  to the skin and wash                       Gently with a scrungie or clean washcloth.  5.  Apply the CHG Soap to your body ONLY FROM THE NECK DOWN.   Do not use on face/ open                           Wound or open sores. Avoid contact with eyes, ears mouth and genitals (private parts).                       Wash face,  Genitals (private parts) with your normal soap.             6.  Wash thoroughly, paying special attention to the area where your surgery  will be performed.  7.  Thoroughly rinse your body with warm water from the neck down.  8.  DO NOT shower/wash with your normal soap after using and rinsing off  the CHG Soap.             9.  Pat yourself dry with a clean towel.            10.  Wear clean pajamas.            11.  Place clean sheets on your bed the night of your first shower and do not  sleep with pets. Day of Surgery : Do not apply any  lotions/deodorants the morning of surgery.  Please wear clean clothes to the hospital/surgery center.

## 2021-07-26 ENCOUNTER — Encounter (HOSPITAL_COMMUNITY): Payer: Self-pay

## 2021-07-26 ENCOUNTER — Encounter (HOSPITAL_COMMUNITY)
Admission: RE | Admit: 2021-07-26 | Discharge: 2021-07-26 | Disposition: A | Discharge status: Home / Self Care | Payer: Medicare Other | Source: Ambulatory Visit | Attending: Surgery | Admitting: Surgery

## 2021-07-26 ENCOUNTER — Other Ambulatory Visit: Payer: Self-pay

## 2021-07-26 VITALS — HR 70 | Temp 98.3°F | Resp 18 | Ht 62.0 in | Wt 203.0 lb

## 2021-07-26 DIAGNOSIS — Z01818 Encounter for other preprocedural examination: Secondary | ICD-10-CM | POA: Diagnosis not present

## 2021-07-26 DIAGNOSIS — D649 Anemia, unspecified: Secondary | ICD-10-CM | POA: Insufficient documentation

## 2021-07-26 LAB — BASIC METABOLIC PANEL
Anion gap: 9 (ref 5–15)
BUN: 11 mg/dL (ref 6–20)
CO2: 26 mmol/L (ref 22–32)
Calcium: 9.2 mg/dL (ref 8.9–10.3)
Chloride: 100 mmol/L (ref 98–111)
Creatinine, Ser: 0.8 mg/dL (ref 0.44–1.00)
GFR, Estimated: >60 mL/min (ref >60)
Glucose, Bld: 94 mg/dL (ref 70–99)
Potassium: 3.2 mmol/L — ABNORMAL LOW (ref 3.5–5.1)
Sodium: 135 mmol/L (ref 135–145)

## 2021-07-26 LAB — CBC
HCT: 36.9 % (ref 36.0–46.0)
Hemoglobin: 11.8 g/dL — ABNORMAL LOW (ref 12.0–15.0)
MCH: 25.1 pg — ABNORMAL LOW (ref 26.0–34.0)
MCHC: 32 g/dL (ref 30.0–36.0)
MCV: 78.3 fL — ABNORMAL LOW (ref 80.0–100.0)
Platelets: 439 10*3/uL — ABNORMAL HIGH (ref 150–400)
RBC: 4.71 MIL/uL (ref 3.87–5.11)
RDW: 16.2 % — ABNORMAL HIGH (ref 11.5–15.5)
WBC: 5.6 10*3/uL (ref 4.0–10.5)
nRBC: 0 % (ref 0.0–0.2)

## 2021-07-26 NOTE — Progress Notes (Signed)
COVID test- NA   PCP - Dr. Johnney Ou Cardiologist - none  Chest x-ray - no EKG- 07/26/21-chart Stress Test - no ECHO - no Cardiac Cath - no Pacemaker/ICD device last checked:NA  Sleep Study - no CPAP -   Fasting Blood Sugar - NA Checks Blood Sugar _____ times a day  Blood Thinner Instructions:NA Aspirin Instructions: Last Dose:  Anesthesia review: yes  Patient denies shortness of breath, fever, cough and chest pain at PAT appointment Pt's BP was up at the PAT visit. She is nervious about this surgery. She said that she took her meds this AM. She has lost 90 lbs over the last 4 years and is working to loose more. I told her to moniter her pressure and call Dr. Jodi Mourning if it is still up closer to surgery.  Patient verbalized understanding of instructions that were given to them at the PAT appointment. Patient was also instructed that they will need to review over the PAT instructions again at home before surgery. Yes

## 2021-08-17 ENCOUNTER — Encounter (HOSPITAL_COMMUNITY): Payer: Self-pay | Admitting: Surgery

## 2021-08-17 NOTE — Anesthesia Preprocedure Evaluation (Addendum)
Anesthesia Evaluation  Patient identified by MRN, date of birth, ID band Patient awake    Reviewed: Allergy & Precautions, NPO status , Patient's Chart, lab work & pertinent test results  Airway Mallampati: II  TM Distance: >3 FB Neck ROM: Full    Dental no notable dental hx. (+) Teeth Intact, Dental Advisory Given   Pulmonary neg pulmonary ROS,    Pulmonary exam normal breath sounds clear to auscultation       Cardiovascular hypertension, Normal cardiovascular exam Rhythm:Regular Rate:Normal     Neuro/Psych negative neurological ROS  negative psych ROS   GI/Hepatic Neg liver ROS, GERD  Controlled and Medicated,  Endo/Other  negative endocrine ROS  Renal/GU negative Renal ROS  negative genitourinary   Musculoskeletal negative musculoskeletal ROS (+)   Abdominal   Peds  Hematology negative hematology ROS (+)   Anesthesia Other Findings   Reproductive/Obstetrics                            Anesthesia Physical Anesthesia Plan  ASA: 2  Anesthesia Plan: General   Post-op Pain Management:    Induction: Intravenous  PONV Risk Score and Plan: 3 and Ondansetron, Dexamethasone and Midazolam  Airway Management Planned: Oral ETT  Additional Equipment:   Intra-op Plan:   Post-operative Plan: Extubation in OR  Informed Consent: I have reviewed the patients History and Physical, chart, labs and discussed the procedure including the risks, benefits and alternatives for the proposed anesthesia with the patient or authorized representative who has indicated his/her understanding and acceptance.     Dental advisory given  Plan Discussed with: CRNA  Anesthesia Plan Comments:       Anesthesia Quick Evaluation

## 2021-08-18 ENCOUNTER — Ambulatory Visit (HOSPITAL_BASED_OUTPATIENT_CLINIC_OR_DEPARTMENT_OTHER): Payer: Medicare Other | Admitting: Anesthesiology

## 2021-08-18 ENCOUNTER — Encounter (HOSPITAL_COMMUNITY)
Admission: RE | Disposition: A | Discharge status: Home / Self Care | Payer: Self-pay | Source: Home / Self Care | Attending: Surgery

## 2021-08-18 ENCOUNTER — Encounter (HOSPITAL_COMMUNITY): Payer: Self-pay | Admitting: Surgery

## 2021-08-18 ENCOUNTER — Ambulatory Visit (HOSPITAL_COMMUNITY): Payer: Medicare Other | Admitting: Anesthesiology

## 2021-08-18 ENCOUNTER — Ambulatory Visit (HOSPITAL_COMMUNITY)
Admission: RE | Admit: 2021-08-18 | Discharge: 2021-08-18 | Disposition: A | Discharge status: Home / Self Care | Payer: Medicare Other | Attending: Surgery | Admitting: Surgery

## 2021-08-18 DIAGNOSIS — Z01818 Encounter for other preprocedural examination: Secondary | ICD-10-CM

## 2021-08-18 DIAGNOSIS — K219 Gastro-esophageal reflux disease without esophagitis: Secondary | ICD-10-CM | POA: Diagnosis not present

## 2021-08-18 DIAGNOSIS — L0591 Pilonidal cyst without abscess: Secondary | ICD-10-CM | POA: Diagnosis not present

## 2021-08-18 HISTORY — PX: PILONIDAL CYST EXCISION: SHX744

## 2021-08-18 LAB — PREGNANCY, URINE: Preg Test, Ur: NEGATIVE

## 2021-08-18 SURGERY — EXCISION, SIMPLE PILONIDAL CYST
Anesthesia: General | Site: Back

## 2021-08-18 MED ORDER — MIDAZOLAM HCL 2 MG/2ML IJ SOLN
INTRAMUSCULAR | Status: AC
  Filled 2021-08-18: qty 2

## 2021-08-18 MED ORDER — DEXMEDETOMIDINE (PRECEDEX) IN NS 20 MCG/5ML (4 MCG/ML) IV SYRINGE
PREFILLED_SYRINGE | INTRAVENOUS | Status: AC
  Filled 2021-08-18: qty 5

## 2021-08-18 MED ORDER — ROCURONIUM BROMIDE 10 MG/ML (PF) SYRINGE
PREFILLED_SYRINGE | INTRAVENOUS | Status: DC | PRN
  Administered 2021-08-18: 100 mg via INTRAVENOUS

## 2021-08-18 MED ORDER — FENTANYL CITRATE PF 50 MCG/ML IJ SOSY: 25.0000 ug | PREFILLED_SYRINGE | INTRAMUSCULAR | Status: DC | PRN

## 2021-08-18 MED ORDER — ONDANSETRON HCL 4 MG/2ML IJ SOLN
INTRAMUSCULAR | Status: DC | PRN
Start: 2021-08-18 — End: 2021-08-18
  Administered 2021-08-18: 4 mg via INTRAVENOUS

## 2021-08-18 MED ORDER — IBUPROFEN 800 MG PO TABS: 800.0000 mg | ORAL_TABLET | Freq: Three times a day (TID) | ORAL | 0 refills | Status: AC

## 2021-08-18 MED ORDER — BUPIVACAINE LIPOSOME 1.3 % IJ SUSP
INTRAMUSCULAR | Status: DC | PRN
  Administered 2021-08-18: 20 mL

## 2021-08-18 MED ORDER — ORAL CARE MOUTH RINSE: 15.0000 mL | Freq: Once | OROMUCOSAL | Status: AC

## 2021-08-18 MED ORDER — BUPIVACAINE LIPOSOME 1.3 % IJ SUSP
INTRAMUSCULAR | Status: AC
  Filled 2021-08-18: qty 20

## 2021-08-18 MED ORDER — SUGAMMADEX SODIUM 500 MG/5ML IV SOLN
INTRAVENOUS | Status: DC | PRN
  Administered 2021-08-18: 400 mg via INTRAVENOUS

## 2021-08-18 MED ORDER — LACTATED RINGERS IV SOLN: INTRAVENOUS | Status: DC

## 2021-08-18 MED ORDER — ROCURONIUM BROMIDE 10 MG/ML (PF) SYRINGE
PREFILLED_SYRINGE | INTRAVENOUS | Status: AC
  Filled 2021-08-18: qty 20

## 2021-08-18 MED ORDER — PROPOFOL 10 MG/ML IV BOLUS
INTRAVENOUS | Status: AC
  Filled 2021-08-18: qty 20

## 2021-08-18 MED ORDER — FENTANYL CITRATE (PF) 100 MCG/2ML IJ SOLN
INTRAMUSCULAR | Status: AC
  Filled 2021-08-18: qty 2

## 2021-08-18 MED ORDER — CEFAZOLIN SODIUM-DEXTROSE 2-4 GM/100ML-% IV SOLN
2.0000 g | INTRAVENOUS | Status: AC
  Administered 2021-08-18: 2 g via INTRAVENOUS
  Filled 2021-08-18: qty 100

## 2021-08-18 MED ORDER — DEXAMETHASONE SODIUM PHOSPHATE 10 MG/ML IJ SOLN
INTRAMUSCULAR | Status: AC
  Filled 2021-08-18: qty 2

## 2021-08-18 MED ORDER — DEXAMETHASONE SODIUM PHOSPHATE 10 MG/ML IJ SOLN
INTRAMUSCULAR | Status: DC | PRN
  Administered 2021-08-18: 10 mg via INTRAVENOUS

## 2021-08-18 MED ORDER — CHLORHEXIDINE GLUCONATE 0.12 % MT SOLN
15.0000 mL | Freq: Once | OROMUCOSAL | Status: AC
  Administered 2021-08-18: 15 mL via OROMUCOSAL

## 2021-08-18 MED ORDER — GABAPENTIN 300 MG PO CAPS
300.0000 mg | ORAL_CAPSULE | ORAL | Status: AC
  Administered 2021-08-18: 300 mg via ORAL
  Filled 2021-08-18: qty 1

## 2021-08-18 MED ORDER — ACETAMINOPHEN 500 MG PO TABS: 1000.0000 mg | ORAL_TABLET | Freq: Once | ORAL | Status: DC

## 2021-08-18 MED ORDER — ESMOLOL HCL 100 MG/10ML IV SOLN
INTRAVENOUS | Status: AC
  Filled 2021-08-18: qty 10

## 2021-08-18 MED ORDER — DEXMEDETOMIDINE (PRECEDEX) IN NS 20 MCG/5ML (4 MCG/ML) IV SYRINGE
PREFILLED_SYRINGE | INTRAVENOUS | Status: DC | PRN
  Administered 2021-08-18 (×2): 8 ug via INTRAVENOUS
  Administered 2021-08-18: 4 ug via INTRAVENOUS

## 2021-08-18 MED ORDER — OXYCODONE HCL 5 MG PO TABS: 5.0000 mg | ORAL_TABLET | Freq: Four times a day (QID) | ORAL | 0 refills | Status: DC | PRN

## 2021-08-18 MED ORDER — MIDAZOLAM HCL 5 MG/5ML IJ SOLN
INTRAMUSCULAR | Status: DC | PRN
  Administered 2021-08-18: 2 mg via INTRAVENOUS

## 2021-08-18 MED ORDER — ACETAMINOPHEN 500 MG PO TABS
1000.0000 mg | ORAL_TABLET | ORAL | Status: AC
  Administered 2021-08-18: 1000 mg via ORAL
  Filled 2021-08-18: qty 2

## 2021-08-18 MED ORDER — 0.9 % SODIUM CHLORIDE (POUR BTL) OPTIME
TOPICAL | Status: DC | PRN
  Administered 2021-08-18: 1000 mL

## 2021-08-18 MED ORDER — PROPOFOL 10 MG/ML IV BOLUS
INTRAVENOUS | Status: DC | PRN
  Administered 2021-08-18: 20 mg via INTRAVENOUS
  Administered 2021-08-18: 160 mg via INTRAVENOUS

## 2021-08-18 MED ORDER — LIDOCAINE 2% (20 MG/ML) 5 ML SYRINGE
INTRAMUSCULAR | Status: DC | PRN
  Administered 2021-08-18: 100 mg via INTRAVENOUS

## 2021-08-18 MED ORDER — FENTANYL CITRATE (PF) 100 MCG/2ML IJ SOLN
INTRAMUSCULAR | Status: DC | PRN
  Administered 2021-08-18: 100 ug via INTRAVENOUS

## 2021-08-18 MED ORDER — LIDOCAINE HCL (PF) 2 % IJ SOLN
INTRAMUSCULAR | Status: AC
  Filled 2021-08-18: qty 10

## 2021-08-18 MED ORDER — ONDANSETRON HCL 4 MG/2ML IJ SOLN
INTRAMUSCULAR | Status: AC
  Filled 2021-08-18: qty 4

## 2021-08-18 SURGICAL SUPPLY — 45 items
APL SKNCLS STERI-STRIP NONHPOA (GAUZE/BANDAGES/DRESSINGS) ×1
BENZOIN TINCTURE PRP APPL 2/3 (GAUZE/BANDAGES/DRESSINGS) ×2 IMPLANT
BLADE EXTENDED COATED 6.5IN (ELECTRODE) IMPLANT
BLADE HEX COATED 2.75 (ELECTRODE) ×2 IMPLANT
BLADE SURG 15 STRL LF DISP TIS (BLADE) ×1 IMPLANT
BLADE SURG 15 STRL SS (BLADE) ×2
BLADE SURG SZ10 CARB STEEL (BLADE) IMPLANT
COVER BACK TABLE 60X90IN (DRAPES) ×2 IMPLANT
COVER MAYO STAND STRL (DRAPES) ×2 IMPLANT
DRAIN PENROSE 0.25X18 (DRAIN) IMPLANT
DRAPE LAPAROTOMY T 98X78 PEDS (DRAPES) ×2 IMPLANT
DRAPE UTILITY XL STRL (DRAPES) ×2 IMPLANT
DRSG PAD ABDOMINAL 8X10 ST (GAUZE/BANDAGES/DRESSINGS) ×2 IMPLANT
ELECT BLADE TIP CTD 4 INCH (ELECTRODE) IMPLANT
ELECT NDL BLADE 2-5/6 (NEEDLE) ×1 IMPLANT
ELECT NEEDLE BLADE 2-5/6 (NEEDLE) ×2 IMPLANT
ELECT REM PT RETURN 15FT ADLT (MISCELLANEOUS) ×2 IMPLANT
GAUZE 4X4 16PLY ~~LOC~~+RFID DBL (SPONGE) ×2 IMPLANT
GAUZE SPONGE 4X4 12PLY STRL (GAUZE/BANDAGES/DRESSINGS) ×2 IMPLANT
GAUZE VASELINE 3X9 (GAUZE/BANDAGES/DRESSINGS) IMPLANT
GLOVE SURG POLYISO LF SZ5.5 (GLOVE) ×2 IMPLANT
GLOVE SURG UNDER POLY LF SZ6 (GLOVE) ×2 IMPLANT
GOWN STRL REUS W/TWL LRG LVL3 (GOWN DISPOSABLE) ×2 IMPLANT
KIT BASIN OR (CUSTOM PROCEDURE TRAY) ×2 IMPLANT
KIT TURNOVER KIT A (KITS) IMPLANT
NDL HYPO 25X1 1.5 SAFETY (NEEDLE) ×1 IMPLANT
NDL SAFETY ECLIPSE 18X1.5 (NEEDLE) IMPLANT
NEEDLE HYPO 18GX1.5 SHARP (NEEDLE)
NEEDLE HYPO 25X1 1.5 SAFETY (NEEDLE) ×2 IMPLANT
PAD ARMBOARD 7.5X6 YLW CONV (MISCELLANEOUS) IMPLANT
PANTS MESH DISP LRG (UNDERPADS AND DIAPERS) IMPLANT
PANTS MESH DISPOSABLE L (UNDERPADS AND DIAPERS)
PENCIL SMOKE EVACUATOR (MISCELLANEOUS) ×2 IMPLANT
SPONGE SURGIFOAM ABS GEL 12-7 (HEMOSTASIS) IMPLANT
SUT CHROMIC 3 0 PS 2 (SUTURE) ×1 IMPLANT
SUT CHROMIC 3 0 SH 27 (SUTURE) ×2 IMPLANT
SUT ETHILON 3 0 PS 1 (SUTURE) ×1 IMPLANT
SUT VIC AB 2-0 SH 27 (SUTURE)
SUT VIC AB 2-0 SH 27XBRD (SUTURE) IMPLANT
SUT VIC AB 3-0 SH 18 (SUTURE) ×2 IMPLANT
SYR CONTROL 10ML LL (SYRINGE) ×2 IMPLANT
TOWEL OR 17X26 10 PK STRL BLUE (TOWEL DISPOSABLE) ×4 IMPLANT
TUBING CONNECTING 10 (TUBING) ×2 IMPLANT
WATER STERILE IRR 500ML POUR (IV SOLUTION) ×2 IMPLANT
YANKAUER SUCT BULB TIP NO VENT (SUCTIONS) ×2 IMPLANT

## 2021-08-18 NOTE — Transfer of Care (Signed)
Immediate Anesthesia Transfer of Care Note  Patient: Christy Huang  Procedure(s) Performed: Procedure(s): EXCISION OF PILONIDAL CYST (N/A)  Patient Location: PACU  Anesthesia Type:General  Level of Consciousness:  sedated, patient cooperative and responds to stimulation  Airway & Oxygen Therapy:Patient Spontanous Breathing and Patient connected to face mask oxgen  Post-op Assessment:  Report given to PACU RN and Post -op Vital signs reviewed and stable  Post vital signs:  Reviewed and stable  Last Vitals:  Vitals:   08/18/21 0542 08/18/21 0836  BP: (!) 147/91 116/87  Pulse: 81 (!) 59  Resp: 17 17  Temp: 36.7 C (!) 36.4 C  SpO2: 100% 96%    Complications: No apparent anesthesia complications

## 2021-08-18 NOTE — Discharge Instructions (Addendum)
[facility] DISCHARGE INSTRUCTIONS   Activity You may sit, but do not bend over at the waist more than 90 degrees - this is to prevent unnecessary tension on your incision. Ok to shower in 24 hours after surgery, but do not bathe or submerge incisions underwater. Do not drive while taking narcotic pain medication.   Wound Care There are sutures in your skin - these will be removed when you come to clinic. You may shower and allow warm soapy water to run over your incision. Gently pat dry. Do not submerge your incision underwater. It is important to keep your incision as clean and dry as possible. Monitor your incision for any new redness, tenderness, or drainage. A small amount of clear or reddish drainage is normal.   When to Call us: Fever greater than 100.5 New redness, drainage, or swelling at incision site Severe pain, nausea, or vomiting     Follow-up You have an appointment scheduled with Dr. Freida Busman on September 09, 2021 at 9:20am. This will be at the [facility] office at [address], [address], [place], [place]. Please arrive at least 15 minutes prior to your scheduled appointment time.   For questions or concerns, please call the office at [phone].

## 2021-08-18 NOTE — Anesthesia Postprocedure Evaluation (Signed)
Anesthesia Post Note  Patient: Christy Huang  Procedure(s) Performed: EXCISION OF PILONIDAL CYST (Back)     Patient location during evaluation: PACU Anesthesia Type: General Level of consciousness: awake and alert Pain management: pain level controlled Vital Signs Assessment: post-procedure vital signs reviewed and stable Respiratory status: spontaneous breathing, nonlabored ventilation, respiratory function stable and patient connected to nasal cannula oxygen Cardiovascular status: blood pressure returned to baseline and stable Postop Assessment: no apparent nausea or vomiting Anesthetic complications: no   No notable events documented.  Last Vitals:  Vitals:   08/18/21 0915 08/18/21 0925  BP: 118/76 131/73  Pulse: 60 (!) 50  Resp: 15 16  Temp: (!) 36.4 C (!) 36.4 C  SpO2: 94% 100%    Last Pain:  Vitals:   08/18/21 0925  TempSrc:   PainSc: 0-No pain                 Amador Braddy L Sohail Capraro

## 2021-08-18 NOTE — H&P (Signed)
Christy Huang is an 41 y.o. female.   Chief Complaint: pilonidal cyst HPI: Christy Huang is a 41 yo female with a pilonidal cyst. Christy Huang had a gluteal abscess in November 2021 that was I&D'd in clinic. Christy Huang has had recurrent pain and drainage since then and has elected to proceed with excision. Christy Huang is not currently having a flare.  Past Medical History:  Diagnosis Date   Acne    Anemia    GERD (gastroesophageal reflux disease)    Hypertension     Past Surgical History:  Procedure Laterality Date   HYDRADENITIS EXCISION Right 05/05/2016   Procedure: EXCISION OF RIGHT AXILLARY HIDRADENITIS;  Surgeon: Berna Bue, MD;  Location: [facility];  Service: General;  Laterality: Right;   HYDRADENITIS EXCISION Left 10/06/2016   Procedure: EXCISION OF LEFT AXILLARY HIDRADENITIS;  Surgeon: Berna Bue, MD;  Location: [facility];  Service: General;  Laterality: Left;    Family History  Problem Relation Age of Onset   Diabetes Mother    Hypertension Mother    Diabetes Maternal Grandmother    Hypertension Maternal Grandmother    Social History:  reports that Christy Huang has never smoked. Christy Huang has never used smokeless tobacco. Christy Huang reports current alcohol use. Christy Huang reports current drug use. Drug: Marijuana.  Allergies:  Allergies  Allergen Reactions   Flagyl [Metronidazole] Anaphylaxis    Pt states Christy Huang can tolerate topically but not in the oral form   Other Hives    Allergic to TB test per patient    Sulfa Antibiotics Hives    Reaction "I just can't have it"    Medications Prior to Admission  Medication Sig Dispense Refill   clindamycin (CLEOCIN T) 1 % external solution APPLY TO AFFECTED AREA TWICE A DAY 60 mL 5   doxycycline (VIBRAMYCIN) 100 MG capsule TAKE 1 CAPSULE BY MOUTH TWICE A DAY 60 capsule 5   omeprazole (PRILOSEC) 20 MG capsule TAKE 1 CAPSULE (20 MG TOTAL) BY MOUTH 2 (TWO) TIMES DAILY BEFORE A MEAL. (Patient taking differently: Take 20 mg by mouth 2 (two) times daily as needed (heartburn).)  180 capsule 1   triamterene-hydrochlorothiazide (DYAZIDE) 37.5-25 MG capsule TAKE 1 EACH (1 CAPSULE TOTAL) BY MOUTH DAILY AFTER BREAKFAST. 90 capsule 3   Bioflavonoid Products (VITAMIN C) CHEW Chew 1 tablet by mouth. (Patient not taking: Reported on 07/21/2021)     clindamycin (CLEOCIN) 300 MG capsule Take 1 capsule (300 mg total) by mouth 3 (three) times daily. 21 capsule 0   fluconazole (DIFLUCAN) 150 MG tablet TAKE 1 TABLET BY MOUTH AS ONE DOSE (Patient not taking: Reported on 07/19/2020) 1 tablet 0   Ginkgo Biloba 60 MG TABS Take 1 tablet by mouth daily. (Patient not taking: Reported on 07/21/2021)     metroNIDAZOLE (METROGEL VAGINAL) 0.75 % vaginal gel Place 1 Applicatorful vaginally 2 (two) times daily. 70 g 0   metroNIDAZOLE (METROGEL) 0.75 % vaginal gel PLACE 1 APPLICATORFUL VAGINALLY AT BEDTIME. APPLY ONE APPLICATORFUL TO VAGINA AT BEDTIME FOR 5 DAYS (Patient not taking: Reported on 07/19/2020) 70 g 0   metroNIDAZOLE (METROGEL) 1 % gel Apply topically daily. 1 applicator vaginally for five days. (Patient not taking: Reported on 07/19/2020) 45 g 0   Multiple Vitamins-Minerals (HAIR SKIN AND NAILS FORMULA) TABS Take 2 tablets by mouth daily. (Patient not taking: Reported on 07/21/2021)     Omega-3 Fatty Acids (FISH OIL) 1000 MG CAPS Take 1,000 mg by mouth daily.  (Patient not taking: Reported on 07/21/2021)  Prenatal Vit-Fe Fumarate-FA (PRENATAL MULTIVITAMIN) TABS tablet Take 1 tablet by mouth daily at 12 noon. (Patient not taking: Reported on 07/21/2021)      Results for orders placed or performed during the hospital encounter of 08/18/21 (from the past 48 hour(s))  Pregnancy, urine per protocol     Status: None   Collection Time: 08/18/21  5:38 AM  Result Value Ref Range   Preg Test, Ur NEGATIVE NEGATIVE    Comment:        THE SENSITIVITY OF THIS METHODOLOGY IS >20 mIU/mL. Performed at [facility], [address], [place], [place] [address]    No results  found.  Review of Systems  Blood pressure (!) 147/91, pulse 81, temperature 98 F (36.7 C), temperature source Oral, resp. rate 17, weight 92.5 kg, SpO2 100 %. Physical Exam Constitutional:      General: Christy Huang is not in acute distress.    Appearance: Normal appearance.  HENT:     Head: Normocephalic and atraumatic.  Eyes:     General: No scleral icterus.    Conjunctiva/sclera: Conjunctivae normal.  Pulmonary:     Effort: Pulmonary effort is normal. No respiratory distress.  Genitourinary:    Comments: Multiple very small pits in the superior gluteal cleft, no induration or drainage. Musculoskeletal:        General: No swelling or deformity. Normal range of motion.  Neurological:     General: No focal deficit present.     Mental Status: Christy Huang is alert and oriented to person, place, and time.  Psychiatric:        Mood and Affect: Mood normal.        Behavior: Behavior normal.     Assessment/Plan 41 yo female with pilonidal disease and recurrent symptoms. Proceed to OR for excision. Plan for discharge home from PACU. Procedure details were reviewed with the patient and informed consent was obtained.  Fritzi Mandes, MD 08/18/2021, 7:20 AM

## 2021-08-18 NOTE — Op Note (Signed)
Date: 08/18/21  Patient: Christy Huang MRN: [id number]  Preoperative Diagnosis: Pilonidal disease Postoperative Diagnosis: Same  Procedure: Excision of pilonidal cyst  Surgeon: Sophronia Simas, MD  EBL: Minimal  Anesthesia: General  Specimens: Pilonidal cyst  Indications: Ms. Fleece is a 41 yo female who has had recurrent pain and drainage in the gluteal area. About a year ago she had an abscess in the gluteal cleft, which was drained in clinic. She has continued to have intermittent flares of pain, swelling, and drainage. Symptoms and exam were consistent with pilonidal disease. After a discussion of the treatment options, she elected to proceed with surgical excision.  Findings: Multiple small pits in the gluteal cleft consistent with pilonidal sinuses. No signs of infection. Area was excised and primary closure was performed.  Procedure details: Informed consent was obtained in the preoperative area prior to the procedure. The patient was brought to the operating room, general anesthesia was induced and appropriate lines and drains were placed for intraoperative monitoring. The patient was placed in the prone position and perioperative antibiotics were administered per SCIP guidelines. The patient was prepped and draped in the usual sterile fashion. A pre-procedure timeout was taken verifying patient identity, surgical site and procedure to be performed.  An elliptical incision was made off-midline, to include the visible pits in the superior gluteal cleft. The subcutaneous tissue was divided with cautery down to the sacral fascia. The specimen was then taken off the fascia with cautery. The specimen was sent for routine pathology. The wound was irrigated and hemostasis was achieved with cautery. No residual inflammatory tissue or sinus tracts were visible in the wound. A subcutaneous flap was raised on the right side of the incision using cautery, to allow the incision close to the left of  midline. The subcutaneous tissue was then closed in 2 layers with interrupted 3-0 Vicryl suture. The skin was closed with 3-0 Nylon interrupted horizontal mattress sutures. A clean gauze dressing was placed.  The patient tolerated the procedure well with no apparent complications. All counts were correct x2 at the end of the procedure. The patient was extubated and taken to PACU in stable condition.  Sophronia Simas, MD 08/18/21 8:59 AM

## 2021-08-18 NOTE — Anesthesia Procedure Notes (Signed)
Procedure Name: Intubation Date/Time: 08/18/2021 7:40 AM Performed by: Lavina Hamman, CRNA Pre-anesthesia Checklist: Patient identified, Emergency Drugs available, Suction available, Patient being monitored and Timeout performed Patient Re-evaluated:Patient Re-evaluated prior to induction Oxygen Delivery Method: Circle system utilized Preoxygenation: Pre-oxygenation with 100% oxygen Induction Type: IV induction Ventilation: Mask ventilation without difficulty Laryngoscope Size: Mac and 4 Grade View: Grade I Tube type: Oral Tube size: 7.5 mm Number of attempts: 1 Airway Equipment and Method: Stylet Placement Confirmation: ETT inserted through vocal cords under direct vision, positive ETCO2, CO2 detector and breath sounds checked- equal and bilateral Secured at: 22 cm Tube secured with: Tape Dental Injury: Teeth and Oropharynx as per pre-operative assessment

## 2021-08-19 ENCOUNTER — Encounter (HOSPITAL_COMMUNITY): Payer: Self-pay | Admitting: Surgery

## 2021-08-19 LAB — SURGICAL PATHOLOGY

## 2021-11-01 ENCOUNTER — Other Ambulatory Visit: Payer: Self-pay | Admitting: Obstetrics

## 2021-11-01 DIAGNOSIS — B3731 Acute candidiasis of vulva and vagina: Secondary | ICD-10-CM

## 2022-04-28 ENCOUNTER — Other Ambulatory Visit: Payer: Self-pay | Admitting: Obstetrics

## 2022-04-28 DIAGNOSIS — L732 Hidradenitis suppurativa: Secondary | ICD-10-CM

## 2022-06-16 ENCOUNTER — Ambulatory Visit (INDEPENDENT_AMBULATORY_CARE_PROVIDER_SITE_OTHER): Payer: Medicare HMO | Admitting: Obstetrics

## 2022-06-16 ENCOUNTER — Other Ambulatory Visit (HOSPITAL_COMMUNITY)
Admission: RE | Admit: 2022-06-16 | Discharge: 2022-06-16 | Disposition: A | Discharge status: Home / Self Care | Payer: Medicare HMO | Source: Ambulatory Visit | Attending: Obstetrics | Admitting: Obstetrics

## 2022-06-16 ENCOUNTER — Encounter: Payer: Self-pay | Admitting: Obstetrics

## 2022-06-16 VITALS — BP 134/89 | HR 69 | Ht 62.0 in | Wt 213.0 lb

## 2022-06-16 DIAGNOSIS — Z01419 Encounter for gynecological examination (general) (routine) without abnormal findings: Secondary | ICD-10-CM

## 2022-06-16 DIAGNOSIS — B3731 Acute candidiasis of vulva and vagina: Secondary | ICD-10-CM

## 2022-06-16 DIAGNOSIS — Z1151 Encounter for screening for human papillomavirus (HPV): Secondary | ICD-10-CM | POA: Diagnosis not present

## 2022-06-16 DIAGNOSIS — E669 Obesity, unspecified: Secondary | ICD-10-CM

## 2022-06-16 DIAGNOSIS — N898 Other specified noninflammatory disorders of vagina: Secondary | ICD-10-CM | POA: Insufficient documentation

## 2022-06-16 DIAGNOSIS — Z113 Encounter for screening for infections with a predominantly sexual mode of transmission: Secondary | ICD-10-CM

## 2022-06-16 DIAGNOSIS — I10 Essential (primary) hypertension: Secondary | ICD-10-CM

## 2022-06-16 DIAGNOSIS — L732 Hidradenitis suppurativa: Secondary | ICD-10-CM

## 2022-06-16 DIAGNOSIS — L0292 Furuncle, unspecified: Secondary | ICD-10-CM

## 2022-06-16 DIAGNOSIS — R42 Dizziness and giddiness: Secondary | ICD-10-CM

## 2022-06-16 DIAGNOSIS — Z1239 Encounter for other screening for malignant neoplasm of breast: Secondary | ICD-10-CM

## 2022-06-16 MED ORDER — MECLIZINE HCL 12.5 MG PO TABS: 12.5000 mg | ORAL_TABLET | Freq: Three times a day (TID) | ORAL | 5 refills | Status: AC | PRN

## 2022-06-16 MED ORDER — TRIAMTERENE-HCTZ 37.5-25 MG PO CAPS: 1.0000 | ORAL_CAPSULE | Freq: Every day | ORAL | 3 refills | Status: DC

## 2022-06-16 MED ORDER — CLINDAMYCIN PHOSPHATE 1 % EX SOLN: CUTANEOUS | 5 refills | Status: DC

## 2022-06-16 MED ORDER — DOXYCYCLINE HYCLATE 100 MG PO CAPS: 100.0000 mg | ORAL_CAPSULE | Freq: Two times a day (BID) | ORAL | 5 refills | Status: DC

## 2022-06-16 MED ORDER — FLUCONAZOLE 150 MG PO TABS: 150.0000 mg | ORAL_TABLET | Freq: Once | ORAL | 0 refills | Status: AC

## 2022-06-16 NOTE — Progress Notes (Signed)
Pt is in the office for annual Last pap 07/19/2020 Declines BC LMP 06/09/22

## 2022-06-16 NOTE — Progress Notes (Signed)
Subjective:        Christy Huang is a 41 y.o. female here for a routine exam.  Current complaints: Vaginal discharge.  Also c/o vertigo.  Personal health questionnaire:  Is patient [name], have a family history of breast and/or ovarian cancer: no Is there a family history of uterine cancer diagnosed at age < 85, gastrointestinal cancer, urinary tract cancer, family member who is a Personnel officer syndrome-associated carrier: no Is the patient overweight and hypertensive, family history of diabetes, personal history of gestational diabetes, preeclampsia or PCOS: no Is patient over 72, have PCOS,  family history of premature CHD under age 58, diabetes, smoke, have hypertension or peripheral artery disease:  no At any time, has a partner hit, kicked or otherwise hurt or frightened you?: no Over the past 2 weeks, have you felt down, depressed or hopeless?: no Over the past 2 weeks, have you felt little interest or pleasure in doing things?:no   Gynecologic History Patient's last menstrual period was 06/09/2022. Contraception: none Last Pap: 2022. Results were: LGSIL Last mammogram: none. Results were: none  Obstetric History OB History  Gravida Para Term Preterm AB Living  3         3  SAB IAB Ectopic Multiple Live Births               # Outcome Date GA Lbr Len/2nd Weight Sex Delivery Anes PTL Lv  3 Gravida           2 Gravida           1 Gravida             Past Medical History:  Diagnosis Date   Acne    Anemia    GERD (gastroesophageal reflux disease)    Hypertension     Past Surgical History:  Procedure Laterality Date   HYDRADENITIS EXCISION Right 05/05/2016   Procedure: EXCISION OF RIGHT AXILLARY HIDRADENITIS;  Surgeon: Berna Bue, MD;  Location: [facility];  Service: General;  Laterality: Right;   HYDRADENITIS EXCISION Left 10/06/2016   Procedure: EXCISION OF LEFT AXILLARY HIDRADENITIS;  Surgeon: Berna Bue, MD;  Location: [facility];  Service: General;   Laterality: Left;   PILONIDAL CYST EXCISION N/A 08/18/2021   Procedure: EXCISION OF PILONIDAL CYST;  Surgeon: Fritzi Mandes, MD;  Location: [facility];  Service: General;  Laterality: N/A;     Current Outpatient Medications:    doxycycline (VIBRAMYCIN) 100 MG capsule, TAKE 1 CAPSULE BY MOUTH TWICE A DAY, Disp: 60 capsule, Rfl: 5   Multiple Vitamins-Minerals (HAIR SKIN AND NAILS FORMULA) TABS, Take 2 tablets by mouth daily., Disp: , Rfl:    Omega-3 Fatty Acids (FISH OIL) 1000 MG CAPS, Take 1,000 mg by mouth daily., Disp: , Rfl:    omeprazole (PRILOSEC) 20 MG capsule, TAKE 1 CAPSULE (20 MG TOTAL) BY MOUTH 2 (TWO) TIMES DAILY BEFORE A MEAL. (Patient taking differently: Take 20 mg by mouth 2 (two) times daily as needed (heartburn).), Disp: 180 capsule, Rfl: 1   triamterene-hydrochlorothiazide (DYAZIDE) 37.5-25 MG capsule, TAKE 1 EACH (1 CAPSULE TOTAL) BY MOUTH DAILY AFTER BREAKFAST., Disp: 90 capsule, Rfl: 3   Bioflavonoid Products (VITAMIN C) CHEW, Chew 1 tablet by mouth. (Patient not taking: Reported on 07/21/2021), Disp: , Rfl:    clindamycin (CLEOCIN T) 1 % external solution, APPLY TO AFFECTED AREA TWICE A DAY (Patient not taking: Reported on 06/16/2022), Disp: 60 mL, Rfl: 5   clindamycin (CLEOCIN) 300 MG capsule, Take 1  capsule (300 mg total) by mouth 3 (three) times daily. (Patient not taking: Reported on 06/16/2022), Disp: 21 capsule, Rfl: 0   fluconazole (DIFLUCAN) 150 MG tablet, TAKE 1 TABLET (150 MG TOTAL) BY MOUTH ONCE FOR 1 DOSE. (Patient not taking: Reported on 06/16/2022), Disp: 1 tablet, Rfl: 0   Ginkgo Biloba 60 MG TABS, Take 1 tablet by mouth daily. (Patient not taking: Reported on 07/21/2021), Disp: , Rfl:    metroNIDAZOLE (METROGEL VAGINAL) 0.75 % vaginal gel, Place 1 Applicatorful vaginally 2 (two) times daily. (Patient not taking: Reported on 06/16/2022), Disp: 70 g, Rfl: 0   metroNIDAZOLE (METROGEL) 0.75 % vaginal gel, PLACE 1 APPLICATORFUL VAGINALLY AT BEDTIME. APPLY ONE APPLICATORFUL  TO VAGINA AT BEDTIME FOR 5 DAYS (Patient not taking: Reported on 07/19/2020), Disp: 70 g, Rfl: 0   metroNIDAZOLE (METROGEL) 1 % gel, Apply topically daily. 1 applicator vaginally for five days. (Patient not taking: Reported on 07/19/2020), Disp: 45 g, Rfl: 0   oxyCODONE (OXY IR/ROXICODONE) 5 MG immediate release tablet, Take 1 tablet (5 mg total) by mouth every 6 (six) hours as needed for severe pain. (Patient not taking: Reported on 06/16/2022), Disp: 20 tablet, Rfl: 0   Prenatal Vit-Fe Fumarate-FA (PRENATAL MULTIVITAMIN) TABS tablet, Take 1 tablet by mouth daily at 12 noon. (Patient not taking: Reported on 07/21/2021), Disp: , Rfl:  Allergies  Allergen Reactions   Flagyl [Metronidazole] Anaphylaxis    Pt states she can tolerate topically but not in the oral form   Other Hives    Allergic to TB test per patient    Sulfa Antibiotics Hives    Reaction "I just can't have it"    Social History   Tobacco Use   Smoking status: Never   Smokeless tobacco: Never  Substance Use Topics   Alcohol use: Yes    Comment: rare    Family History  Problem Relation Age of Onset   Diabetes Mother    Hypertension Mother    Diabetes Maternal Grandmother    Hypertension Maternal Grandmother       Review of Systems  Constitutional: negative for fatigue and weight loss Respiratory: negative for cough and wheezing Cardiovascular: negative for chest pain, fatigue and palpitations Gastrointestinal: negative for abdominal pain and change in bowel habits Musculoskeletal:negative for myalgias Neurological:  positive for vertigo.  negative for gait problems and tremors Behavioral/Psych: negative for abusive relationship, depression Endocrine: negative for temperature intolerance    Genitourinary positive for vaginal discharge.  :negative for abnormal menstrual periods, genital lesions, hot flashes, sexual problems  Integument/breast:  positive for hidradenitis and boils.   negative for breast lump, breast  tenderness, nipple discharge     Objective:       BP 134/89   Pulse 69   Ht 5\' 2"  (1.575 m)   Wt 213 lb (96.6 kg)   LMP 06/09/2022   BMI 38.96 kg/m  General:   Alert and no distress  Skin:   no rash or abnormalities  Lungs:   clear to auscultation bilaterally  Heart:   regular rate and rhythm, S1, S2 normal, no murmur, click, rub or gallop  Breasts:   normal without suspicious masses, skin or nipple changes or axillary nodes  Abdomen:  normal findings: no organomegaly, soft, non-tender and no hernia  Pelvis:  External genitalia: normal general appearance Urinary system: urethral meatus normal and bladder without fullness, nontender Vaginal: normal without tenderness, induration or masses Cervix: normal appearance Adnexa: normal bimanual exam Uterus: anteverted and non-tender, normal size  Lab Review Urine pregnancy test Labs reviewed yes Radiologic studies reviewed no  I have spent a total of 20 minutes of face-to-face time, excluding clinical staff time, reviewing notes and preparing to see patient, ordering tests and/or medications, and counseling the patient.   Assessment:    1. Encounter for gynecological examination with Papanicolaou smear of cervix Rx: - Cytology - PAP( [facility])  2. Vaginal discharge Rx: - Cervicovaginal ancillary only( )  3. Screening for STD (sexually transmitted disease) Rx: - HIV antibody (with reflex) - RPR - Hepatitis B Surface AntiGEN - Hepatitis C Antibody  4. Obesity (BMI 35.0-39.9 without comorbidity) - weight reduction recommended  5. Essential hypertension Rx: - triamterene-hydrochlorothiazide (DYAZIDE) 37.5-25 MG capsule; Take 1 each (1 capsule total) by mouth daily after breakfast.  Dispense: 90 capsule; Refill: 3  6. Screening breast examination Rx: - MM Digital Screening; Future  7. Vertigo Rx: - meclizine (ANTIVERT) 12.5 MG tablet; Take 1 tablet (12.5 mg total) by mouth 3 (three) times daily as  needed for dizziness.  Dispense: 30 tablet; Refill: 5  8. Candida vaginitis Rx: - fluconazole (DIFLUCAN) 150 MG tablet; Take 1 tablet (150 mg total) by mouth once for 1 dose.  Dispense: 1 tablet; Refill: 0  9. Hidradenitis axillaris Rx: - doxycycline (VIBRAMYCIN) 100 MG capsule; Take 1 capsule (100 mg total) by mouth 2 (two) times daily.  Dispense: 60 capsule; Refill: 5 - clindamycin (CLEOCIN T) 1 % external solution; APPLY TO AFFECTED AREA TWICE A DAY  Dispense: 60 mL; Refill: 5  10. Boils of multiple sites Rx: - clindamycin (CLEOCIN T) 1 % external solution; APPLY TO AFFECTED AREA TWICE A DAY  Dispense: 60 mL; Refill: 5     Plan:    Education reviewed: calcium supplements, depression evaluation, low fat, low cholesterol diet, safe sex/STD prevention, self breast exams, smoking cessation, and weight bearing exercise. Contraception: none. Mammogram ordered. Follow up in: 1 year.   No orders of the defined types were placed in this encounter.  Orders Placed This Encounter  Procedures   HIV antibody (with reflex)   RPR   Hepatitis B Surface AntiGEN   Hepatitis C Antibody    Breelyn Icard mA. Clearance Coots MD 06/16/2022

## 2022-06-17 LAB — HEPATITIS C ANTIBODY: Hep C Virus Ab: NONREACTIVE

## 2022-06-17 LAB — HIV ANTIBODY (ROUTINE TESTING W REFLEX): HIV Screen 4th Generation wRfx: NONREACTIVE

## 2022-06-17 LAB — RPR: RPR Ser Ql: NONREACTIVE

## 2022-06-17 LAB — HEPATITIS B SURFACE ANTIGEN: Hepatitis B Surface Ag: NEGATIVE

## 2022-06-20 LAB — CERVICOVAGINAL ANCILLARY ONLY
Bacterial Vaginitis (gardnerella): NEGATIVE
Candida Glabrata: NEGATIVE
Candida Vaginitis: NEGATIVE
Chlamydia: NEGATIVE
Comment: NEGATIVE
Comment: NEGATIVE
Comment: NEGATIVE
Comment: NEGATIVE
Comment: NEGATIVE
Comment: NORMAL
Neisseria Gonorrhea: NEGATIVE
Trichomonas: NEGATIVE

## 2022-06-22 LAB — CYTOLOGY - PAP
Comment: NEGATIVE
Diagnosis: UNDETERMINED — AB
High risk HPV: NEGATIVE

## 2022-06-23 ENCOUNTER — Telehealth: Payer: Self-pay | Admitting: Emergency Medicine

## 2022-06-23 NOTE — Telephone Encounter (Signed)
-----   Message from Charles A Fleek, MD sent at 06/22/2022  9:52 PM EST ----- ASCUS with negative High Risk HPV.  Repeat pap in 1 year. 

## 2022-06-23 NOTE — Telephone Encounter (Signed)
Attempted TC to discuss results

## 2022-06-28 ENCOUNTER — Encounter: Payer: Self-pay | Admitting: Emergency Medicine

## 2022-06-28 ENCOUNTER — Telehealth: Payer: Self-pay | Admitting: Emergency Medicine

## 2022-06-28 NOTE — Telephone Encounter (Signed)
-----   Message from Shelly Bombard, MD sent at 06/22/2022  9:52 PM EST ----- ASCUS with negative High Risk HPV.  Repeat pap in 1 year.

## 2022-06-28 NOTE — Telephone Encounter (Signed)
Pt informed of results. Interested in [place] vaccine. MyChart info sent. Transferred to front desk to schedule first dose.

## 2022-06-30 ENCOUNTER — Ambulatory Visit: Payer: Medicare HMO

## 2022-08-25 ENCOUNTER — Ambulatory Visit
Admission: RE | Admit: 2022-08-25 | Discharge: 2022-08-25 | Disposition: A | Discharge status: Home / Self Care | Payer: Medicare HMO | Source: Ambulatory Visit | Attending: Obstetrics | Admitting: Obstetrics

## 2022-08-25 DIAGNOSIS — Z1239 Encounter for other screening for malignant neoplasm of breast: Secondary | ICD-10-CM

## 2022-08-30 ENCOUNTER — Telehealth (INDEPENDENT_AMBULATORY_CARE_PROVIDER_SITE_OTHER): Payer: Medicare HMO | Admitting: Obstetrics

## 2022-08-30 ENCOUNTER — Encounter: Payer: Self-pay | Admitting: Obstetrics

## 2022-08-30 ENCOUNTER — Other Ambulatory Visit: Payer: Self-pay | Admitting: Obstetrics

## 2022-08-30 DIAGNOSIS — R928 Other abnormal and inconclusive findings on diagnostic imaging of breast: Secondary | ICD-10-CM

## 2022-08-30 NOTE — Progress Notes (Signed)
TELEHEALTH GYNECOLOGY VISIT ENCOUNTER NOTE  Provider location: [facility]   Patient location: Home  I connected with Christy Huang on 08/30/22 at 11:15 AM EST by telephone and verified that I am speaking with the correct person using two identifiers. Patient was unable to do MyChart audiovisual encounter due to technical difficulties, she tried several times.    I discussed the limitations, risks, security and privacy concerns of performing an evaluation and management service by telephone and the availability of in person appointments. I also discussed with the patient that there may be a patient responsible charge related to this service. The patient expressed understanding and agreed to proceed.   History:  Christy Huang is a 42 y.o. G3P0 female being evaluated today for mammogram results. She denies any abnormal vaginal discharge, bleeding, pelvic pain or other concerns.       Past Medical History:  Diagnosis Date   Acne    Anemia    GERD (gastroesophageal reflux disease)    Hypertension    Past Surgical History:  Procedure Laterality Date   HYDRADENITIS EXCISION Right 05/05/2016   Procedure: EXCISION OF RIGHT AXILLARY HIDRADENITIS;  Surgeon: Clovis Riley, MD;  Location: [facility];  Service: General;  Laterality: Right;   HYDRADENITIS EXCISION Left 10/06/2016   Procedure: EXCISION OF LEFT AXILLARY HIDRADENITIS;  Surgeon: Clovis Riley, MD;  Location: [facility];  Service: General;  Laterality: Left;   PILONIDAL CYST EXCISION N/A 08/18/2021   Procedure: EXCISION OF PILONIDAL CYST;  Surgeon: Dwan Bolt, MD;  Location: [facility];  Service: General;  Laterality: N/A;   The following portions of the patient's history were reviewed and updated as appropriate: allergies, current medications, past family history, past medical history, past social history, past surgical history and problem list.   Health Maintenance:  Normal pap and negative HRHPV on  06-16-22.  Abnormal mammogram on 08-29-22.   Review of Systems:  Pertinent items noted in HPI and remainder of comprehensive ROS otherwise negative.  Physical Exam:   General:  Alert, oriented and cooperative.   Mental Status: Normal mood and affect perceived. Normal judgment and thought content.  Physical exam deferred due to nature of the encounter  Labs and Imaging No results found for this or any previous visit (from the past 336 hour(s)). MM 3D SCREEN BREAST BILATERAL  Result Date: 08/29/2022 CLINICAL DATA:  Screening. EXAM: DIGITAL SCREENING BILATERAL MAMMOGRAM WITH TOMOSYNTHESIS AND CAD TECHNIQUE: Bilateral screening digital craniocaudal and mediolateral oblique mammograms were obtained. Bilateral screening digital breast tomosynthesis was performed. The images were evaluated with computer-aided detection. COMPARISON:  None available. ACR Breast Density Category a: The breasts are almost entirely fatty. FINDINGS: In the left breast, a possible mass warrants further evaluation. In the right breast, no findings suspicious for malignancy. IMPRESSION: Further evaluation is suggested for a possible mass in the left breast. RECOMMENDATION: Diagnostic mammogram and possibly ultrasound of the left breast. (Code:[id number]) The patient will be contacted regarding the findings, and additional imaging will be scheduled. BI-RADS CATEGORY  0: Incomplete: Need additional imaging evaluation. Electronically Signed   By: Kristopher Oppenheim M.D.   On: 08/29/2022 15:52       Assessment and Plan:     1. Abnormal mammogram of left breast - discussed result of possible mass, and the need for follow up ultrasound of left breast.  All questions answered to patient's satisfaction.       I discussed the assessment and treatment plan with  the patient. The patient was provided an opportunity to ask questions and all were answered. The patient agreed with the plan and demonstrated an understanding of the instructions.    The patient was advised to call back or seek an in-person evaluation/go to the ED if the symptoms worsen or if the condition fails to improve as anticipated.  I have spent a total of 10 minutes of non-face-to-face time, excluding clinical staff time, reviewing notes and preparing to see patient, ordering tests and/or medications, and counseling the patient.    Baltazar Najjar, MD [facility], [place], [facility] 08/30/22

## 2022-09-13 ENCOUNTER — Ambulatory Visit
Admission: RE | Admit: 2022-09-13 | Discharge: 2022-09-13 | Disposition: A | Discharge status: Home / Self Care | Payer: Medicare HMO | Source: Ambulatory Visit | Attending: Obstetrics | Admitting: Obstetrics

## 2022-09-13 DIAGNOSIS — R928 Other abnormal and inconclusive findings on diagnostic imaging of breast: Secondary | ICD-10-CM

## 2023-04-14 ENCOUNTER — Other Ambulatory Visit: Payer: Self-pay | Admitting: Obstetrics

## 2023-04-14 DIAGNOSIS — L732 Hidradenitis suppurativa: Secondary | ICD-10-CM

## 2023-06-03 ENCOUNTER — Other Ambulatory Visit: Payer: Self-pay | Admitting: Obstetrics

## 2023-06-03 DIAGNOSIS — I10 Essential (primary) hypertension: Secondary | ICD-10-CM

## 2023-06-22 ENCOUNTER — Ambulatory Visit (INDEPENDENT_AMBULATORY_CARE_PROVIDER_SITE_OTHER): Payer: Medicare HMO | Admitting: Obstetrics and Gynecology

## 2023-06-22 ENCOUNTER — Other Ambulatory Visit (HOSPITAL_COMMUNITY)
Admission: RE | Admit: 2023-06-22 | Discharge: 2023-06-22 | Disposition: A | Discharge status: Home / Self Care | Payer: Medicare HMO | Source: Ambulatory Visit | Attending: Obstetrics and Gynecology | Admitting: Obstetrics and Gynecology

## 2023-06-22 ENCOUNTER — Encounter: Payer: Self-pay | Admitting: Obstetrics and Gynecology

## 2023-06-22 VITALS — BP 131/89 | HR 69 | Wt 227.7 lb

## 2023-06-22 DIAGNOSIS — Z1339 Encounter for screening examination for other mental health and behavioral disorders: Secondary | ICD-10-CM | POA: Diagnosis not present

## 2023-06-22 DIAGNOSIS — Z01419 Encounter for gynecological examination (general) (routine) without abnormal findings: Secondary | ICD-10-CM | POA: Diagnosis present

## 2023-06-22 DIAGNOSIS — Z0000 Encounter for general adult medical examination without abnormal findings: Secondary | ICD-10-CM

## 2023-06-22 DIAGNOSIS — Z1151 Encounter for screening for human papillomavirus (HPV): Secondary | ICD-10-CM | POA: Diagnosis not present

## 2023-06-22 DIAGNOSIS — I10 Essential (primary) hypertension: Secondary | ICD-10-CM

## 2023-06-22 DIAGNOSIS — L732 Hidradenitis suppurativa: Secondary | ICD-10-CM

## 2023-06-22 DIAGNOSIS — Z124 Encounter for screening for malignant neoplasm of cervix: Secondary | ICD-10-CM

## 2023-06-22 MED ORDER — DOXYCYCLINE HYCLATE 100 MG PO CAPS: 100.0000 mg | ORAL_CAPSULE | Freq: Two times a day (BID) | ORAL | 5 refills | Status: AC

## 2023-06-22 MED ORDER — TRIAMTERENE-HCTZ 37.5-25 MG PO CAPS: 1.0000 | ORAL_CAPSULE | Freq: Every day | ORAL | 3 refills | Status: DC

## 2023-06-22 MED ORDER — FLUCONAZOLE 150 MG PO TABS: 150.0000 mg | ORAL_TABLET | Freq: Once | ORAL | 2 refills | Status: AC

## 2023-06-22 NOTE — Progress Notes (Signed)
ANNUAL EXAM Patient name: Christy Huang MRN [id number]  Date of birth: [birth year] Chief Complaint:   Annual Exam  History of Present Illness:   Christy Huang is a 42 y.o. G3P0 with Patient's last menstrual period was 06/15/2023 (approximate). being seen today for a routine annual exam.  Current complaints:  Trying to lose weight Not currently sexually active  Last pap 06/16/22. Results were: ASCUS w/ HRHPV negative. 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 Results were: BI-RADS 2 after diagnostic mammo/US. Family h/o breast cancer: no Last colonoscopy: n/a. Results were: N/A. Family h/o colorectal cancer: no HPV vaccine: considering     06/22/2023    9:22 AM 03/01/2017    3:15 PM 01/25/2017    2:14 PM 12/21/2016    2:02 PM 11/23/2016   10:11 AM  Depression screen PHQ 2/9  Decreased Interest 0 0 0 0 0  Down, Depressed, Hopeless 0 0 0 0 0  PHQ - 2 Score 0 0 0 0 0  Altered sleeping 0      Tired, decreased energy 0      Change in appetite 0      Feeling bad or failure about yourself  0      Trouble concentrating 0      Moving slowly or fidgety/restless 0      Suicidal thoughts 0      PHQ-9 Score 0            06/22/2023    9:24 AM  GAD 7 : Generalized Anxiety Score  Nervous, Anxious, on Edge 0  Control/stop worrying 0  Worry too much - different things 0  Trouble relaxing 0  Restless 0  Easily annoyed or irritable 0  Afraid - awful might happen 0  Total GAD 7 Score 0     Review of Systems:   Pertinent items are noted in HPI Denies any headaches, blurred vision, fatigue, shortness of breath, chest pain, abdominal pain, abnormal vaginal discharge/itching/odor/irritation, problems with periods, bowel movements, urination, or intercourse unless otherwise stated above. Pertinent History Reviewed:  Reviewed past medical,surgical, social and family history.  Reviewed problem list, medications and allergies. Physical Assessment:   Vitals:    06/22/23 0916 06/22/23 0920 06/22/23 0928  BP: (!) 139/93 (!) 135/97 131/89  Pulse: 69    Weight: 227 lb 11.2 oz (103.3 kg)    Body mass index is 41.65 kg/m.        Physical Examination:   General appearance - well appearing, and in no distress  Mental status - alert, oriented to person, place, and time  Chest - respiratory effort normal  Heart - normal peripheral perfusion  Breasts - breasts appear normal, no suspicious masses, no skin or nipple changes or axillary nodes  Abdomen - soft, nontender, nondistended, no masses or organomegaly  Pelvic - VULVA: normal appearing vulva with no masses, tenderness or lesions  VAGINA: normal appearing vagina with normal color and discharge, no lesions  CERVIX: normal appearing cervix without discharge or lesions, no CMT  Thin prep pap is done with HR HPV cotesting  Chaperone present for exam  No results found for this or any previous visit (from the past 24 hours).  Assessment & Plan:  1) Well-Woman Exam Mammogram: in 1 year, or sooner if problems Colonoscopy: @ 42yo, or sooner if problems Pap: Collected today Gardasil: Considering. Will get if pap abnormal GC/CT: Declines HIV/HCV: Declines  Refills sent on BP and HS medications Discussed strategies  for weight loss  Labs/procedures today:  No orders of the defined types were placed in this encounter.   Meds:  Meds ordered this encounter  Medications   doxycycline (VIBRAMYCIN) 100 MG capsule    Sig: Take 1 capsule (100 mg total) by mouth 2 (two) times daily.    Dispense:  60 capsule    Refill:  5    DX Code Needed  REFILL NEEDED.   triamterene-hydrochlorothiazide (DYAZIDE) 37.5-25 MG capsule    Sig: Take 1 each (1 capsule total) by mouth daily after breakfast.    Dispense:  90 capsule    Refill:  3   fluconazole (DIFLUCAN) 150 MG tablet    Sig: Take 1 tablet (150 mg total) by mouth once for 1 dose. For two doses    Dispense:  1 tablet    Refill:  2    Follow-up: Return in  about 6 months (around 12/21/2023) for follow up weight loss.  Lennart Pall, MD 06/22/2023 9:52 AM

## 2023-06-22 NOTE — Progress Notes (Signed)
Pt presents for AEX. Concerned about not being able to lose weight. Requesting PAP, declines STD testing.

## 2023-06-28 LAB — CYTOLOGY - PAP
Adequacy: ABSENT
Comment: NEGATIVE
Diagnosis: NEGATIVE
High risk HPV: NEGATIVE

## 2023-08-17 ENCOUNTER — Other Ambulatory Visit: Payer: Self-pay | Admitting: Obstetrics

## 2023-08-17 DIAGNOSIS — L0292 Furuncle, unspecified: Secondary | ICD-10-CM

## 2023-08-17 DIAGNOSIS — L732 Hidradenitis suppurativa: Secondary | ICD-10-CM

## 2023-11-02 ENCOUNTER — Other Ambulatory Visit: Payer: Self-pay

## 2023-11-02 DIAGNOSIS — I10 Essential (primary) hypertension: Secondary | ICD-10-CM

## 2023-11-02 MED ORDER — TRIAMTERENE-HCTZ 37.5-25 MG PO CAPS: 1.0000 | ORAL_CAPSULE | Freq: Every day | ORAL | 1 refills | Status: DC

## 2023-11-02 MED ORDER — FLUCONAZOLE 150 MG PO TABS: 150.0000 mg | ORAL_TABLET | Freq: Once | ORAL | 0 refills | Status: AC

## 2023-11-02 NOTE — Progress Notes (Signed)
 Diflucan  rx sent per protocol for pt yeast infection symptoms.

## 2023-12-12 ENCOUNTER — Telehealth: Payer: Self-pay

## 2023-12-12 NOTE — Telephone Encounter (Signed)
Returned call, no answer, vm is not setup 

## 2024-02-12 ENCOUNTER — Other Ambulatory Visit: Payer: Self-pay | Admitting: Obstetrics

## 2024-02-12 DIAGNOSIS — I10 Essential (primary) hypertension: Secondary | ICD-10-CM

## 2024-02-12 MED ORDER — TRIAMTERENE-HCTZ 37.5-25 MG PO CAPS: 1.0000 | ORAL_CAPSULE | Freq: Every day | ORAL | 11 refills | Status: AC
